# Patient Record
Sex: Male | Born: 1935 | Hispanic: No | Marital: Single | Smoking: Never smoker
Health system: Southern US, Community
[De-identification: ages and names within clinical notes are randomized; demographics above are authoritative.]

---

## 2005-12-19 ENCOUNTER — Encounter: Admission: RE | Admit: 2005-12-19 | Discharge: 2005-12-19 | Payer: Self-pay | Admitting: Sports Medicine

## 2010-06-25 DIAGNOSIS — M542 Cervicalgia: Secondary | ICD-10-CM | POA: Insufficient documentation

## 2010-06-25 DIAGNOSIS — M549 Dorsalgia, unspecified: Secondary | ICD-10-CM | POA: Insufficient documentation

## 2010-06-25 DIAGNOSIS — G319 Degenerative disease of nervous system, unspecified: Secondary | ICD-10-CM | POA: Insufficient documentation

## 2012-08-25 ENCOUNTER — Other Ambulatory Visit: Payer: Self-pay | Admitting: Internal Medicine

## 2012-08-25 DIAGNOSIS — R269 Unspecified abnormalities of gait and mobility: Secondary | ICD-10-CM

## 2012-08-25 DIAGNOSIS — W19XXXD Unspecified fall, subsequent encounter: Secondary | ICD-10-CM

## 2012-08-28 ENCOUNTER — Ambulatory Visit
Admission: RE | Admit: 2012-08-28 | Discharge: 2012-08-28 | Disposition: A | Discharge status: Home / Self Care | Payer: Medicare Other | Source: Ambulatory Visit | Attending: Internal Medicine | Admitting: Internal Medicine

## 2012-08-28 DIAGNOSIS — R269 Unspecified abnormalities of gait and mobility: Secondary | ICD-10-CM

## 2012-08-28 DIAGNOSIS — W19XXXD Unspecified fall, subsequent encounter: Secondary | ICD-10-CM

## 2012-12-24 ENCOUNTER — Encounter: Payer: Self-pay | Admitting: Gastroenterology

## 2013-01-21 ENCOUNTER — Ambulatory Visit: Payer: Medicare Other | Admitting: Gastroenterology

## 2013-02-17 ENCOUNTER — Telehealth: Payer: Self-pay | Admitting: Medical Oncology

## 2013-02-17 NOTE — Telephone Encounter (Signed)
Opened by mistake.

## 2013-03-15 ENCOUNTER — Other Ambulatory Visit: Payer: Self-pay | Admitting: Medical Oncology

## 2013-03-15 NOTE — Telephone Encounter (Signed)
Opened by mistake.

## 2018-09-05 ENCOUNTER — Inpatient Hospital Stay (HOSPITAL_COMMUNITY)
Admission: EM | Admit: 2018-09-05 | Discharge: 2018-09-10 | DRG: 871 | Disposition: A | Discharge status: Skilled Nursing Facility | Payer: Medicare Other | Attending: Internal Medicine | Admitting: Internal Medicine

## 2018-09-05 ENCOUNTER — Encounter (HOSPITAL_COMMUNITY): Payer: Self-pay

## 2018-09-05 ENCOUNTER — Emergency Department (HOSPITAL_COMMUNITY): Payer: Medicare Other

## 2018-09-05 DIAGNOSIS — Z20828 Contact with and (suspected) exposure to other viral communicable diseases: Secondary | ICD-10-CM | POA: Diagnosis present

## 2018-09-05 DIAGNOSIS — F0390 Unspecified dementia without behavioral disturbance: Secondary | ICD-10-CM | POA: Diagnosis present

## 2018-09-05 DIAGNOSIS — N179 Acute kidney failure, unspecified: Secondary | ICD-10-CM | POA: Diagnosis present

## 2018-09-05 DIAGNOSIS — G9340 Encephalopathy, unspecified: Secondary | ICD-10-CM

## 2018-09-05 DIAGNOSIS — G9341 Metabolic encephalopathy: Secondary | ICD-10-CM | POA: Diagnosis present

## 2018-09-05 DIAGNOSIS — D649 Anemia, unspecified: Secondary | ICD-10-CM | POA: Diagnosis present

## 2018-09-05 DIAGNOSIS — N3001 Acute cystitis with hematuria: Secondary | ICD-10-CM

## 2018-09-05 DIAGNOSIS — R296 Repeated falls: Secondary | ICD-10-CM | POA: Diagnosis present

## 2018-09-05 DIAGNOSIS — N390 Urinary tract infection, site not specified: Secondary | ICD-10-CM

## 2018-09-05 DIAGNOSIS — R410 Disorientation, unspecified: Secondary | ICD-10-CM | POA: Diagnosis not present

## 2018-09-05 DIAGNOSIS — Z66 Do not resuscitate: Secondary | ICD-10-CM | POA: Diagnosis present

## 2018-09-05 DIAGNOSIS — A419 Sepsis, unspecified organism: Secondary | ICD-10-CM | POA: Diagnosis present

## 2018-09-05 LAB — CBC WITH DIFFERENTIAL/PLATELET
Abs Immature Granulocytes: 0.14 10*3/uL — ABNORMAL HIGH (ref 0.00–0.07)
Basophils Absolute: 0 10*3/uL (ref 0.0–0.1)
Basophils Relative: 0 %
Eosinophils Absolute: 0 10*3/uL (ref 0.0–0.5)
Eosinophils Relative: 0 %
HCT: 33.3 % — ABNORMAL LOW (ref 39.0–52.0)
Hemoglobin: 9.3 g/dL — ABNORMAL LOW (ref 13.0–17.0)
Immature Granulocytes: 1 %
Lymphocytes Relative: 7 %
Lymphs Abs: 0.9 10*3/uL (ref 0.7–4.0)
MCH: 23.7 pg — ABNORMAL LOW (ref 26.0–34.0)
MCHC: 27.9 g/dL — ABNORMAL LOW (ref 30.0–36.0)
MCV: 84.9 fL (ref 80.0–100.0)
Monocytes Absolute: 2.4 10*3/uL — ABNORMAL HIGH (ref 0.1–1.0)
Monocytes Relative: 18 %
Neutro Abs: 9.4 10*3/uL — ABNORMAL HIGH (ref 1.7–7.7)
Neutrophils Relative %: 74 %
Platelets: 251 10*3/uL (ref 150–400)
RBC: 3.92 MIL/uL — ABNORMAL LOW (ref 4.22–5.81)
RDW: 19.3 % — ABNORMAL HIGH (ref 11.5–15.5)
WBC: 12.9 10*3/uL — ABNORMAL HIGH (ref 4.0–10.5)
nRBC: 0 % (ref 0.0–0.2)

## 2018-09-05 LAB — COMPREHENSIVE METABOLIC PANEL
ALT: 13 U/L (ref 0–44)
AST: 19 U/L (ref 15–41)
Albumin: 2.5 g/dL — ABNORMAL LOW (ref 3.5–5.0)
Alkaline Phosphatase: 102 U/L (ref 38–126)
Anion gap: 8 (ref 5–15)
BUN: 29 mg/dL — ABNORMAL HIGH (ref 8–23)
CO2: 23 mmol/L (ref 22–32)
Calcium: 8.9 mg/dL (ref 8.9–10.3)
Chloride: 105 mmol/L (ref 98–111)
Creatinine, Ser: 1.33 mg/dL — ABNORMAL HIGH (ref 0.61–1.24)
GFR calc Af Amer: 53 mL/min — ABNORMAL LOW (ref >60)
GFR calc non Af Amer: 46 mL/min — ABNORMAL LOW (ref >60)
Glucose, Bld: 117 mg/dL — ABNORMAL HIGH (ref 70–99)
Potassium: 4.1 mmol/L (ref 3.5–5.1)
Sodium: 136 mmol/L (ref 135–145)
Total Bilirubin: 1.9 mg/dL — ABNORMAL HIGH (ref 0.3–1.2)
Total Protein: 6.8 g/dL (ref 6.5–8.1)

## 2018-09-05 LAB — URINALYSIS, ROUTINE W REFLEX MICROSCOPIC
Bilirubin Urine: NEGATIVE
Glucose, UA: NEGATIVE mg/dL
Hgb urine dipstick: NEGATIVE
Ketones, ur: 5 mg/dL — AB
Nitrite: POSITIVE — AB
Protein, ur: NEGATIVE mg/dL
Specific Gravity, Urine: 1.023 (ref 1.005–1.030)
pH: 5 (ref 5.0–8.0)

## 2018-09-05 LAB — VITAMIN B12: Vitamin B-12: 488 pg/mL (ref 180–914)

## 2018-09-05 LAB — AMMONIA: Ammonia: 11 umol/L (ref 9–35)

## 2018-09-05 LAB — TSH: TSH: 3.41 u[IU]/mL (ref 0.350–4.500)

## 2018-09-05 LAB — CBG MONITORING, ED: Glucose-Capillary: 106 mg/dL — ABNORMAL HIGH (ref 70–99)

## 2018-09-05 LAB — IRON AND TIBC
Iron: 16 ug/dL — ABNORMAL LOW (ref 45–182)
Saturation Ratios: 6 % — ABNORMAL LOW (ref 17.9–39.5)
TIBC: 266 ug/dL (ref 250–450)
UIBC: 250 ug/dL

## 2018-09-05 LAB — RETICULOCYTES
Immature Retic Fract: 30.2 % — ABNORMAL HIGH (ref 2.3–15.9)
RBC.: 3.83 MIL/uL — ABNORMAL LOW (ref 4.22–5.81)
Retic Count, Absolute: 64.7 K/uL (ref 19.0–186.0)
Retic Ct Pct: 1.7 % (ref 0.4–3.1)

## 2018-09-05 LAB — FERRITIN: Ferritin: 21 ng/mL — ABNORMAL LOW (ref 24–336)

## 2018-09-05 LAB — PROTIME-INR
INR: 1.1 (ref 0.8–1.2)
Prothrombin Time: 14.3 seconds (ref 11.4–15.2)

## 2018-09-05 LAB — CK: Total CK: 116 U/L (ref 49–397)

## 2018-09-05 LAB — LIPASE, BLOOD: Lipase: 21 U/L (ref 11–51)

## 2018-09-05 LAB — SARS CORONAVIRUS 2 BY RT PCR (HOSPITAL ORDER, PERFORMED IN ~~LOC~~ HOSPITAL LAB): SARS Coronavirus 2: NEGATIVE

## 2018-09-05 LAB — FOLATE: Folate: 12.1 ng/mL

## 2018-09-05 LAB — TROPONIN I: Troponin I: <0.03 ng/mL

## 2018-09-05 LAB — MAGNESIUM: Magnesium: 2.1 mg/dL (ref 1.7–2.4)

## 2018-09-05 MED ORDER — ENOXAPARIN SODIUM 40 MG/0.4ML ~~LOC~~ SOLN
40.0000 mg | SUBCUTANEOUS | Status: DC
  Administered 2018-09-05 – 2018-09-09 (×5): 40 mg via SUBCUTANEOUS
  Filled 2018-09-05 (×5): qty 0.4

## 2018-09-05 MED ORDER — SODIUM CHLORIDE 0.9 % IV SOLN
1.0000 g | INTRAVENOUS | Status: DC
  Administered 2018-09-06: 1 g via INTRAVENOUS
  Filled 2018-09-05 (×2): qty 10

## 2018-09-05 MED ORDER — LORAZEPAM 2 MG/ML IJ SOLN
1.0000 mg | Freq: Once | INTRAMUSCULAR | Status: AC
  Administered 2018-09-05: 1 mg via INTRAVENOUS
  Filled 2018-09-05: qty 1

## 2018-09-05 MED ORDER — HALOPERIDOL LACTATE 5 MG/ML IJ SOLN
2.0000 mg | Freq: Four times a day (QID) | INTRAMUSCULAR | Status: DC | PRN
  Administered 2018-09-07 – 2018-09-09 (×2): 2 mg via INTRAVENOUS
  Filled 2018-09-05 (×3): qty 1

## 2018-09-05 MED ORDER — ACETAMINOPHEN 325 MG PO TABS
650.0000 mg | ORAL_TABLET | Freq: Four times a day (QID) | ORAL | Status: DC | PRN
  Administered 2018-09-07: 650 mg via ORAL
  Filled 2018-09-05 (×2): qty 2

## 2018-09-05 MED ORDER — SODIUM CHLORIDE 0.9 % IV BOLUS
500.0000 mL | Freq: Once | INTRAVENOUS | Status: AC
  Administered 2018-09-05: 500 mL via INTRAVENOUS

## 2018-09-05 MED ORDER — ONDANSETRON HCL 4 MG PO TABS: 4.0000 mg | ORAL_TABLET | Freq: Four times a day (QID) | ORAL | Status: DC | PRN

## 2018-09-05 MED ORDER — SODIUM CHLORIDE 0.9 % IV SOLN
1.0000 g | Freq: Once | INTRAVENOUS | Status: AC
  Administered 2018-09-05: 1 g via INTRAVENOUS
  Filled 2018-09-05: qty 10

## 2018-09-05 MED ORDER — SODIUM CHLORIDE 0.9 % IV SOLN
INTRAVENOUS | Status: DC
  Administered 2018-09-05 – 2018-09-06 (×2): via INTRAVENOUS

## 2018-09-05 MED ORDER — ONDANSETRON HCL 4 MG/2ML IJ SOLN
4.0000 mg | Freq: Four times a day (QID) | INTRAMUSCULAR | Status: DC | PRN
  Administered 2018-09-08: 4 mg via INTRAVENOUS
  Filled 2018-09-05: qty 2

## 2018-09-05 MED ORDER — ACETAMINOPHEN 650 MG RE SUPP: 650.0000 mg | Freq: Four times a day (QID) | RECTAL | Status: DC | PRN

## 2018-09-05 MED ORDER — LORAZEPAM 2 MG/ML IJ SOLN
0.5000 mg | Freq: Once | INTRAMUSCULAR | Status: AC
  Administered 2018-09-05: 0.5 mg via INTRAVENOUS
  Filled 2018-09-05: qty 1

## 2018-09-05 NOTE — H&P (Signed)
History and Physical    Arthur Sims AOZ:[id number] DOB: 22-May-1924 DOA: 09/05/2018  Referring MD/NP/PA: EDP PCP:  Patient coming from: Home  Chief Complaint: Confusion, falls  HPI: Arthur Sims is a 85 y.o. male with no significant past medical history, patient has not been to a doctor in over 10 years, does not take any medications on a regular basis.  He lives by himself but has a daughter that checks on him frequently she reports mild memory and cognitive decline for months to a year. -Over the last 3 to 4 days patient has been more confused than usual, daughter reports multiple falls as well over the last 3 days, on Thursday EMS was called following a fall but patient refused to come to the ED for further evaluation subsequently he fell couple more times he denies any pain anywhere, has been more agitated and confused over the last couple of days which is why his daughter had him brought to the emergency room today ED Course: Vital signs stable, labs notable for creatinine of 1.3, white count of 12.9, hemoglobin of 9.3, abnormal UA with positive nitrite, leuk esterase, many bacteria and 6-10 WBCs, CT head noted diffuse atrophy, no acute abnormalities, SARS COVID-19 PCR was negative. -Patient was agitated in the emergency room was given Ativan IV x1 however continues to be angry agitated and confused  Review of Systems: Unable to obtain due to confusion  History reviewed. No pertinent past medical history.  History reviewed. No pertinent surgical history.   has no history on file for tobacco, alcohol, and drug.  No Known Allergies  No family history on file.   Prior to Admission medications   Not on File    Physical Exam: Vitals:   09/05/18 1415 09/05/18 1500 09/05/18 1530 09/05/18 1600  BP: 114/72 117/69 (!) 111/97 125/71  Pulse: 76 87    Resp: Temp:      TempSrc:      SpO2: 99% 99%        Constitutional: Awake alert disoriented confused and  agitated Vitals:   09/05/18 1415 09/05/18 1500 09/05/18 1530 09/05/18 1600  BP: 114/72 117/69 (!) 111/97 125/71  Pulse: 76 87    Resp: Temp:      TempSrc:      SpO2: 99% 99%     Eyes: PERRL, lids and conjunctivae normal ENMT: Mucous membranes are moist.  Neck: normal, supple Respiratory: Good air movement bilaterally, normal respiratory effort  Cardiovascular: Regular rate and rhythm, no murmurs / rubs / gallops Abdomen: Soft, nontender, nondistended, bowel sounds present Musculoskeletal: No joint deformity upper and lower extremities. Ext: No edema Skin: no rashes, lesions, ulcers.  Neurologic: Awake alert, disoriented, confused, does not follow commands, moves all extremities involuntarily Psychiatric: Confused, unable to assess  Labs on Admission: I have personally reviewed following labs and imaging studies  CBC: Recent Labs  Lab 09/05/18 1204  WBC 12.9*  NEUTROABS 9.4*  HGB 9.3*  HCT 33.3*  MCV 84.9  PLT 251   Basic Metabolic Panel: Recent Labs  Lab 09/05/18 1204  NA 136  K 4.1  CL 105  CO2 23  GLUCOSE 117*  BUN 29*  CREATININE 1.33*  CALCIUM 8.9  MG 2.1   GFR: CrCl cannot be calculated (Unknown ideal weight.). Liver Function Tests: Recent Labs  Lab 09/05/18 1204  AST 19  ALT 13  ALKPHOS 102  BILITOT 1.9*  PROT 6.8  ALBUMIN 2.5*  Recent Labs  Lab 09/05/18 1204  LIPASE 21   Recent Labs  Lab 09/05/18 1204  AMMONIA 11   Coagulation Profile: Recent Labs  Lab 09/05/18 1204  INR 1.1   Cardiac Enzymes: Recent Labs  Lab 09/05/18 1204  CKTOTAL 116  TROPONINI <0.03   BNP (last 3 results) No results for input(s): PROBNP in the last 8760 hours. HbA1C: No results for input(s): HGBA1C in the last 72 hours. CBG: Recent Labs  Lab 09/05/18 1153  GLUCAP 106*   Lipid Profile: No results for input(s): CHOL, HDL, LDLCALC, TRIG, CHOLHDL, LDLDIRECT in the last 72 hours. Thyroid Function Tests: Recent Labs    09/05/18 1204   TSH 3.410   Anemia Panel: No results for input(s): VITAMINB12, FOLATE, FERRITIN, TIBC, IRON, RETICCTPCT in the last 72 hours. Urine analysis:    Component Value Date/Time   COLORURINE AMBER (A) 09/05/2018 1440   APPEARANCEUR HAZY (A) 09/05/2018 1440   LABSPEC 1.023 09/05/2018 1440   PHURINE 5.0 09/05/2018 1440   GLUCOSEU NEGATIVE 09/05/2018 1440   HGBUR NEGATIVE 09/05/2018 1440   BILIRUBINUR NEGATIVE 09/05/2018 1440   KETONESUR 5 (A) 09/05/2018 1440   PROTEINUR NEGATIVE 09/05/2018 1440   NITRITE POSITIVE (A) 09/05/2018 1440   LEUKOCYTESUR SMALL (A) 09/05/2018 1440   Sepsis Labs: (procalcitonin:4,lacticidven:4) ) Recent Results (from the past 240 hour(s))  SARS Coronavirus 2 (CEPHEID - Performed in [facility] hospital lab), Hosp Order     Status: None   Collection Time: 09/05/18  1:10 PM  Result Value Ref Range Status   SARS Coronavirus 2 NEGATIVE NEGATIVE Final    Comment: (NOTE) If result is NEGATIVE SARS-CoV-2 target nucleic acids are NOT DETECTED. The SARS-CoV-2 RNA is generally detectable in upper and lower  respiratory specimens during the acute phase of infection. The lowest  concentration of SARS-CoV-2 viral copies this assay can detect is 250  copies / mL. A negative result does not preclude SARS-CoV-2 infection  and should not be used as the sole basis for treatment or other  patient management decisions.  A negative result may occur with  improper specimen collection / handling, submission of specimen other  than nasopharyngeal swab, presence of viral mutation(s) within the  areas targeted by this assay, and inadequate number of viral copies  (<250 copies / mL). A negative result must be combined with clinical  observations, patient history, and epidemiological information. If result is POSITIVE SARS-CoV-2 target nucleic acids are DETECTED. The SARS-CoV-2 RNA is generally detectable in upper and lower  respiratory specimens dur ing the acute phase  of infection.  Positive  results are indicative of active infection with SARS-CoV-2.  Clinical  correlation with patient history and other diagnostic information is  necessary to determine patient infection status.  Positive results do  not rule out bacterial infection or co-infection with other viruses. If result is PRESUMPTIVE POSTIVE SARS-CoV-2 nucleic acids MAY BE PRESENT.   A presumptive positive result was obtained on the submitted specimen  and confirmed on repeat testing.  While 2019 novel coronavirus  (SARS-CoV-2) nucleic acids may be present in the submitted sample  additional confirmatory testing may be necessary for epidemiological  and / or clinical management purposes  to differentiate between  SARS-CoV-2 and other Sarbecovirus currently known to infect humans.  If clinically indicated additional testing with an alternate test  methodology [phone]) is advised. The SARS-CoV-2 RNA is generally  detectable in upper and lower respiratory sp ecimens during the acute  phase of infection. The expected result  is Negative. Fact Sheet for Patients:  BoilerBrush.com.cyhttps://www.fda.gov/media/136312/download Fact Sheet for Healthcare Providers: https://pope.com/https://www.fda.gov/media/136313/download This test is not yet approved or cleared by the [place] FDA and has been authorized for detection and/or diagnosis of SARS-CoV-2 by FDA under an Emergency Use Authorization (EUA).  This EUA will remain in effect (meaning this test can be used) for the duration of the COVID-19 declaration under Section 564(b)(1) of the Act, 21 U.S.C. section 360bbb-3(b)(1), unless the authorization is terminated or revoked sooner. Performed at [facility], [address] [place], [place] [address]      Radiological Exams on Admission: Dg Chest 2 View  Result Date: 09/05/2018 CLINICAL DATA:  Fall and altered mental status. EXAM: CHEST - 2 VIEW COMPARISON:  None FINDINGS: Two views of the chest demonstrate low lung  volumes. Coarse lung markings may be chronic. Heart size is upper limits of normal and likely accentuated by low inspiratory effort. However, there may be an enlarged ascending thoracic aorta. Trachea is midline. Negative for a pneumothorax. Bridging osteophytes in the mid and lower thoracic spine region. No large pleural effusions. IMPRESSION: No active cardiopulmonary disease. Concern for enlarged ascending thoracic aorta. Electronically Signed   By: Richarda OverlieAdam  Henn M.D.   On: 09/05/2018 13:37   Ct Head Wo Contrast  Result Date: 09/05/2018 CLINICAL DATA:  85 year old with multiple falls. EXAM: CT HEAD WITHOUT CONTRAST CT CERVICAL SPINE WITHOUT CONTRAST TECHNIQUE: Multidetector CT imaging of the head and cervical spine was performed following the standard protocol without intravenous contrast. Multiplanar CT image reconstructions of the cervical spine were also generated. COMPARISON:  08/28/2012 FINDINGS: CT HEAD FINDINGS Brain: Diffuse cerebral atrophy. Extensive low density throughout the white matter and similar to the prior examination. No evidence for acute hemorrhage, mass lesion, midline shift, hydrocephalus or large infarct. Vascular: No hyperdense vessel or unexpected calcification. Skull: Normal. Negative for fracture or focal lesion. Sinuses/Orbits: Small amount of mucosal disease in the sphenoid sinuses. Other: None. CT CERVICAL SPINE FINDINGS Alignment: Normal. Skull base and vertebrae: No acute fracture. No primary bone lesion or focal pathologic process. Soft tissues and spinal canal: No prevertebral fluid or swelling. No visible canal hematoma. Disc levels: Mild disc space narrowing throughout the cervical spine, most prominent at C3-C4. Facet arthropathy along the right side C2-C3. Upper chest: Negative. Other: None IMPRESSION: 1. No acute intracranial abnormality. 2. No acute bone abnormality in cervical spine. 3. Diffuse cerebral atrophy. Chronic white matter disease likely represents chronic  small vessel ischemic changes. Electronically Signed   By: Richarda OverlieAdam  Henn M.D.   On: 09/05/2018 14:22   Ct Cervical Spine Wo Contrast  Result Date: 09/05/2018 CLINICAL DATA:  85 year old with multiple falls. EXAM: CT HEAD WITHOUT CONTRAST CT CERVICAL SPINE WITHOUT CONTRAST TECHNIQUE: Multidetector CT imaging of the head and cervical spine was performed following the standard protocol without intravenous contrast. Multiplanar CT image reconstructions of the cervical spine were also generated. COMPARISON:  08/28/2012 FINDINGS: CT HEAD FINDINGS Brain: Diffuse cerebral atrophy. Extensive low density throughout the white matter and similar to the prior examination. No evidence for acute hemorrhage, mass lesion, midline shift, hydrocephalus or large infarct. Vascular: No hyperdense vessel or unexpected calcification. Skull: Normal. Negative for fracture or focal lesion. Sinuses/Orbits: Small amount of mucosal disease in the sphenoid sinuses. Other: None. CT CERVICAL SPINE FINDINGS Alignment: Normal. Skull base and vertebrae: No acute fracture. No primary bone lesion or focal pathologic process. Soft tissues and spinal canal: No prevertebral fluid or swelling. No visible canal hematoma. Disc levels:  Mild disc space narrowing throughout the cervical spine, most prominent at C3-C4. Facet arthropathy along the right side C2-C3. Upper chest: Negative. Other: None IMPRESSION: 1. No acute intracranial abnormality. 2. No acute bone abnormality in cervical spine. 3. Diffuse cerebral atrophy. Chronic white matter disease likely represents chronic small vessel ischemic changes. Electronically Signed   By: Richarda Overlie M.D.   On: 09/05/2018 14:22    EKG: Independently reviewed.  Normal sinus rhythm, no acute ST-T wave changes  Assessment/Plan Active Problems:   Acute metabolic encephalopathy -Daughter reports acute decompensation in cognition over the last 3 to 4 days in the background of mild cognitive decline and memory  loss -CT head unremarkable, except for chronic atrophy -UA is abnormal, suspicious for UTI, unable to obtain history regarding this, no evidence of urinary retention when INO cath completed per RN -IV ceftriaxone every 24 hours, follow-up urine cultures -Hydrate with IV fluids -Chest x-ray was unremarkable -Check TSH and B12 levels as well -Use Haldol as needed for severe agitation, QTC is 457    Sepsis secondary to UTI (HCC) -As above, monitor for retention  Renal insufficiency -Baseline unknown,  -hydrate saline at 75 cc an hour and monitor  Anemia -Baseline unknown, check anemia panel  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

## 2018-09-05 NOTE — Progress Notes (Signed)
NEW ADMISSION NOTE New Admission Note:   Arrival Method:  From ED via stretcher  Mental Orientation: alert to self  Telemetry: box 6 Assessment: Completed Skin: see assessment  IV:LAC  Pain: denies  Tubes: none  Safety Measures: Safety Fall Prevention Plan has been discussed. LOW Bed in place, floor mats also in place and bed alarm on.  Admission: Pending due to altered mental status  [facility] [place] Orientation: Patient has been orientated to the room, unit and staff.  Family:none   Orders have been reviewed and implemented. Will continue to monitor the patient. Call light has been placed within reach and bed alarm has been activated.   Leonia Reeves, RN

## 2018-09-05 NOTE — ED Notes (Signed)
ED TO INPATIENT HANDOFF REPORT  ED Nurse Name and Phone #: [phone] Hannie  S Name/Age/Gender Arthur Sims 85 y.o. male Room/Bed: 024C/024C  Code Status   Code Status: Not on file  Home/SNF/Other Home Patient oriented to: self Is this baseline? No   Triage Complete: Triage complete  Chief Complaint Altered  Triage Note Pt brought in by [facility] from home for multiple falls over the past several days and AMS. Pt A+Ox4 at baseline, currently is only oriented to self. Pt denies pain, no obvious injuries. Pt does not see a PCP. Pt daughter Arthur Sims [phone]. Pt skin warm and dry.   Allergies No Known Allergies  Level of Care/Admitting Diagnosis ED Disposition    ED Disposition Condition Comment   Admit  Hospital Area: [facility] [[id number]]  Level of Care: Med-Surg [16]  Covid Evaluation: N/A  Diagnosis: Acute metabolic encephalopathy [[id number]]  Admitting Physician: Zannie Cove [[id number]]  Attending Physician: Zannie Cove [[id number]]  Estimated length of stay: past midnight tomorrow  Certification:: I certify this patient will need inpatient services for at least 2 midnights  PT Class (Do Not Modify): Inpatient [101]  PT Acc Code (Do Not Modify): Private [1]       B Medical/Surgery History History reviewed. No pertinent past medical history. History reviewed. No pertinent surgical history.   A IV Location/Drains/Wounds Patient Lines/Drains/Airways Status   Active Line/Drains/Airways    Name:   Placement date:   Placement time:   Site:   Days:   Peripheral IV 09/05/18 Left Antecubital   09/05/18    1201    Antecubital   less than 1          Intake/Output Last 24 hours No intake or output data in the 24 hours ending 09/05/18 1658  Labs/Imaging Results for orders placed or performed during the hospital encounter of 09/05/18 (from the past 48 hour(s))  CBG monitoring, ED     Status: Abnormal   Collection Time: 09/05/18 11:53 AM  Result Value Ref  Range   Glucose-Capillary 106 (H) 70 - 99 mg/dL   Comment 1 Notify RN    Comment 2 Document in Chart   Comprehensive metabolic panel     Status: Abnormal   Collection Time: 09/05/18 12:04 PM  Result Value Ref Range   Sodium 136 135 - 145 mmol/L   Potassium 4.1 3.5 - 5.1 mmol/L   Chloride 105 98 - 111 mmol/L   CO2 23 22 - 32 mmol/L   Glucose, Bld 117 (H) 70 - 99 mg/dL   BUN 29 (H) 8 - 23 mg/dL   Creatinine, Ser 0.76 (H) 0.61 - 1.24 mg/dL   Calcium 8.9 8.9 - 22.6 mg/dL   Total Protein 6.8 6.5 - 8.1 g/dL   Albumin 2.5 (L) 3.5 - 5.0 g/dL   AST 19 15 - 41 U/L   ALT 13 0 - 44 U/L   Alkaline Phosphatase 102 38 - 126 U/L   Total Bilirubin 1.9 (H) 0.3 - 1.2 mg/dL   GFR calc non Af Amer 46 (L) >60 mL/min   GFR calc Af Amer 53 (L) >60 mL/min   Anion gap 8 5 - 15    Comment: Performed at [facility], [address], [place], [place] [address]  Lipase, blood     Status: None   Collection Time: 09/05/18 12:04 PM  Result Value Ref Range   Lipase 21 11 - 51 U/L    Comment: Performed at [facility]  Lab, [address], [place], [place] [address]  Ammonia     Status: None   Collection Time: 09/05/18 12:04 PM  Result Value Ref Range   Ammonia 11 9 - 35 umol/L    Comment: Performed at [facility], [address], [place], [place] [address]  Protime-INR     Status: None   Collection Time: 09/05/18 12:04 PM  Result Value Ref Range   Prothrombin Time 14.3 11.4 - 15.2 seconds   INR 1.1 0.8 - 1.2    Comment: (NOTE) INR goal varies based on device and disease states. Performed at [facility], [address] [place], [place] [address]   CBC with Differential     Status: Abnormal   Collection Time: 09/05/18 12:04 PM  Result Value Ref Range   WBC 12.9 (H) 4.0 - 10.5 K/uL   RBC 3.92 (L) 4.22 - 5.81 MIL/uL   Hemoglobin 9.3 (L) 13.0 - 17.0 g/dL   HCT 44.0 (L) 10.2 - 72.5 %   MCV 84.9 80.0 - 100.0 fL   MCH 23.7 (L) 26.0 - 34.0 pg   MCHC 27.9 (L) 30.0 - 36.0 g/dL   RDW  36.6 (H) 44.0 - 15.5 %   Platelets 251 150 - 400 K/uL   nRBC 0.0 0.0 - 0.2 %   Neutrophils Relative % 74 %   Neutro Abs 9.4 (H) 1.7 - 7.7 K/uL   Lymphocytes Relative 7 %   Lymphs Abs 0.9 0.7 - 4.0 K/uL   Monocytes Relative 18 %   Monocytes Absolute 2.4 (H) 0.1 - 1.0 K/uL   Eosinophils Relative 0 %   Eosinophils Absolute 0.0 0.0 - 0.5 K/uL   Basophils Relative 0 %   Basophils Absolute 0.0 0.0 - 0.1 K/uL   Immature Granulocytes 1 %   Abs Immature Granulocytes 0.14 (H) 0.00 - 0.07 K/uL    Comment: Performed at [facility], [address] [place], [place] [address]  Magnesium     Status: None   Collection Time: 09/05/18 12:04 PM  Result Value Ref Range   Magnesium 2.1 1.7 - 2.4 mg/dL    Comment: Performed at [facility], [address] [place], [place] [address]  TSH     Status: None   Collection Time: 09/05/18 12:04 PM  Result Value Ref Range   TSH 3.410 0.350 - 4.500 uIU/mL    Comment: Performed by a 3rd Generation assay with a functional sensitivity of <=0.01 uIU/mL. Performed at [facility], [address], [place], [place] [address]   CK     Status: None   Collection Time: 09/05/18 12:04 PM  Result Value Ref Range   Total CK 116 49 - 397 U/L    Comment: Performed at [facility], [address] [place], [place] [address]  Troponin I - ONCE - STAT     Status: None   Collection Time: 09/05/18 12:04 PM  Result Value Ref Range   Troponin I <0.03 <0.03 ng/mL    Comment: Performed at [facility], [address], [place], [place] [address]  SARS Coronavirus 2 (CEPHEID - Performed in [facility] hospital lab), Hosp Order     Status: None   Collection Time: 09/05/18  1:10 PM  Result Value Ref Range   SARS Coronavirus 2 NEGATIVE NEGATIVE    Comment: (NOTE) If result is NEGATIVE SARS-CoV-2 target nucleic acids are NOT DETECTED. The SARS-CoV-2 RNA is generally detectable  in upper and lower  respiratory specimens during the acute phase of  infection. The lowest  concentration of SARS-CoV-2 viral copies this assay can detect is 250  copies / mL. A negative result does not preclude SARS-CoV-2 infection  and should not be used as the sole basis for treatment or other  patient management decisions.  A negative result may occur with  improper specimen collection / handling, submission of specimen other  than nasopharyngeal swab, presence of viral mutation(s) within the  areas targeted by this assay, and inadequate number of viral copies  (<250 copies / mL). A negative result must be combined with clinical  observations, patient history, and epidemiological information. If result is POSITIVE SARS-CoV-2 target nucleic acids are DETECTED. The SARS-CoV-2 RNA is generally detectable in upper and lower  respiratory specimens dur ing the acute phase of infection.  Positive  results are indicative of active infection with SARS-CoV-2.  Clinical  correlation with patient history and other diagnostic information is  necessary to determine patient infection status.  Positive results do  not rule out bacterial infection or co-infection with other viruses. If result is PRESUMPTIVE POSTIVE SARS-CoV-2 nucleic acids MAY BE PRESENT.   A presumptive positive result was obtained on the submitted specimen  and confirmed on repeat testing.  While 2019 novel coronavirus  (SARS-CoV-2) nucleic acids may be present in the submitted sample  additional confirmatory testing may be necessary for epidemiological  and / or clinical management purposes  to differentiate between  SARS-CoV-2 and other Sarbecovirus currently known to infect humans.  If clinically indicated additional testing with an alternate test  methodology [phone]) is advised. The SARS-CoV-2 RNA is generally  detectable in upper and lower respiratory sp ecimens during the acute  phase of infection. The expected result is Negative. Fact Sheet for Patients:   BoilerBrush.com.cy Fact Sheet for Healthcare Providers: https://pope.com/ This test is not yet approved or cleared by the [place] FDA and has been authorized for detection and/or diagnosis of SARS-CoV-2 by FDA under an Emergency Use Authorization (EUA).  This EUA will remain in effect (meaning this test can be used) for the duration of the COVID-19 declaration under Section 564(b)(1) of the Act, 21 U.S.C. section 360bbb-3(b)(1), unless the authorization is terminated or revoked sooner. Performed at [facility], [address] [place], [place] [address]   Urinalysis, Routine w reflex microscopic     Status: Abnormal   Collection Time: 09/05/18  2:40 PM  Result Value Ref Range   Color, Urine AMBER (A) YELLOW    Comment: BIOCHEMICALS MAY BE AFFECTED BY COLOR   APPearance HAZY (A) CLEAR   Specific Gravity, Urine 1.023 1.005 - 1.030   pH 5.0 5.0 - 8.0   Glucose, UA NEGATIVE NEGATIVE mg/dL   Hgb urine dipstick NEGATIVE NEGATIVE   Bilirubin Urine NEGATIVE NEGATIVE   Ketones, ur 5 (A) NEGATIVE mg/dL   Protein, ur NEGATIVE NEGATIVE mg/dL   Nitrite POSITIVE (A) NEGATIVE   Leukocytes,Ua SMALL (A) NEGATIVE   RBC / HPF 0-5 0 - 5 RBC/hpf   WBC, UA 6-10 0 - 5 WBC/hpf   Bacteria, UA MANY (A) NONE SEEN   Squamous Epithelial / LPF 0-5 0 - 5   Mucus PRESENT    Hyaline Casts, UA PRESENT    Ca Oxalate Crys, UA PRESENT     Comment: Performed at [facility], [address] [place], [place] [address]   Dg Chest 2 View  Result Date: 09/05/2018 CLINICAL  DATA:  Fall and altered mental status. EXAM: CHEST - 2 VIEW COMPARISON:  None FINDINGS: Two views of the chest demonstrate low lung volumes. Coarse lung markings may be chronic. Heart size is upper limits of normal and likely accentuated by low inspiratory effort. However, there may be an enlarged ascending thoracic aorta. Trachea is midline. Negative for a pneumothorax. Bridging  osteophytes in the mid and lower thoracic spine region. No large pleural effusions. IMPRESSION: No active cardiopulmonary disease. Concern for enlarged ascending thoracic aorta. Electronically Signed   By: Richarda OverlieAdam  Henn M.D.   On: 09/05/2018 13:37   Ct Head Wo Contrast  Result Date: 09/05/2018 CLINICAL DATA:  85 year old with multiple falls. EXAM: CT HEAD WITHOUT CONTRAST CT CERVICAL SPINE WITHOUT CONTRAST TECHNIQUE: Multidetector CT imaging of the head and cervical spine was performed following the standard protocol without intravenous contrast. Multiplanar CT image reconstructions of the cervical spine were also generated. COMPARISON:  08/28/2012 FINDINGS: CT HEAD FINDINGS Brain: Diffuse cerebral atrophy. Extensive low density throughout the white matter and similar to the prior examination. No evidence for acute hemorrhage, mass lesion, midline shift, hydrocephalus or large infarct. Vascular: No hyperdense vessel or unexpected calcification. Skull: Normal. Negative for fracture or focal lesion. Sinuses/Orbits: Small amount of mucosal disease in the sphenoid sinuses. Other: None. CT CERVICAL SPINE FINDINGS Alignment: Normal. Skull base and vertebrae: No acute fracture. No primary bone lesion or focal pathologic process. Soft tissues and spinal canal: No prevertebral fluid or swelling. No visible canal hematoma. Disc levels: Mild disc space narrowing throughout the cervical spine, most prominent at C3-C4. Facet arthropathy along the right side C2-C3. Upper chest: Negative. Other: None IMPRESSION: 1. No acute intracranial abnormality. 2. No acute bone abnormality in cervical spine. 3. Diffuse cerebral atrophy. Chronic white matter disease likely represents chronic small vessel ischemic changes. Electronically Signed   By: Richarda OverlieAdam  Henn M.D.   On: 09/05/2018 14:22   Ct Cervical Spine Wo Contrast  Result Date: 09/05/2018 CLINICAL DATA:  85 year old with multiple falls. EXAM: CT HEAD WITHOUT CONTRAST CT CERVICAL  SPINE WITHOUT CONTRAST TECHNIQUE: Multidetector CT imaging of the head and cervical spine was performed following the standard protocol without intravenous contrast. Multiplanar CT image reconstructions of the cervical spine were also generated. COMPARISON:  08/28/2012 FINDINGS: CT HEAD FINDINGS Brain: Diffuse cerebral atrophy. Extensive low density throughout the white matter and similar to the prior examination. No evidence for acute hemorrhage, mass lesion, midline shift, hydrocephalus or large infarct. Vascular: No hyperdense vessel or unexpected calcification. Skull: Normal. Negative for fracture or focal lesion. Sinuses/Orbits: Small amount of mucosal disease in the sphenoid sinuses. Other: None. CT CERVICAL SPINE FINDINGS Alignment: Normal. Skull base and vertebrae: No acute fracture. No primary bone lesion or focal pathologic process. Soft tissues and spinal canal: No prevertebral fluid or swelling. No visible canal hematoma. Disc levels: Mild disc space narrowing throughout the cervical spine, most prominent at C3-C4. Facet arthropathy along the right side C2-C3. Upper chest: Negative. Other: None IMPRESSION: 1. No acute intracranial abnormality. 2. No acute bone abnormality in cervical spine. 3. Diffuse cerebral atrophy. Chronic white matter disease likely represents chronic small vessel ischemic changes. Electronically Signed   By: Richarda OverlieAdam  Henn M.D.   On: 09/05/2018 14:22    Pending Labs Unresulted Labs (From admission, onward)    Start     Ordered   09/05/18 1644  Vitamin B12  (Anemia Panel (PNL))  Add-on,   R     09/05/18 1644   09/05/18 1644  Folate  (  Anemia Panel (PNL))  Add-on,   R     09/05/18 1644   09/05/18 1644  Iron and TIBC  (Anemia Panel (PNL))  Add-on,   R     09/05/18 1644   09/05/18 1644  Ferritin  (Anemia Panel (PNL))  Add-on,   R     09/05/18 1644   09/05/18 1644  Reticulocytes  (Anemia Panel (PNL))  Add-on,   R     09/05/18 1644   09/05/18 1202  Urine culture  ONCE - STAT,    STAT     09/05/18 1201   Signed and Held  CBC  (enoxaparin (LOVENOX)    CrCl >/= 30 ml/min)  Once,   R    Comments:  Baseline for enoxaparin therapy IF NOT ALREADY DRAWN.  Notify MD if PLT < 100 K.    Signed and Held   Signed and Held  Creatinine, serum  (enoxaparin (LOVENOX)    CrCl >/= 30 ml/min)  Once,   R    Comments:  Baseline for enoxaparin therapy IF NOT ALREADY DRAWN.    Signed and Held   Signed and Held  Creatinine, serum  (enoxaparin (LOVENOX)    CrCl >/= 30 ml/min)  Weekly,   R    Comments:  while on enoxaparin therapy    Signed and Held   Signed and Held  CBC  Tomorrow morning,   R     Signed and Held   Signed and Held  Basic metabolic panel  Tomorrow morning,   R     Signed and Held   Signed and Held  TSH  Add-on,   R     Signed and Held          Vitals/Pain Today's Vitals   09/05/18 1500 09/05/18 1530 09/05/18 1600 09/05/18 1638  BP: 117/69 (!) 111/97 125/71 99/83  Pulse: 87   91  Resp: Temp:      TempSrc:      SpO2: 99%   98%    Isolation Precautions No active isolations  Medications Medications  cefTRIAXone (ROCEPHIN) 1 g in sodium chloride 0.9 % 100 mL IVPB (1 g Intravenous New Bag/Given 09/05/18 1635)  haloperidol lactate (HALDOL) injection 2 mg (has no administration in time range)  sodium chloride 0.9 % bolus 500 mL (500 mLs Intravenous New Bag/Given 09/05/18 1529)  LORazepam (ATIVAN) injection 0.5 mg (0.5 mg Intravenous Given 09/05/18 1547)  LORazepam (ATIVAN) injection 1 mg (1 mg Intravenous Given 09/05/18 1627)    Mobility walks with device High fall risk   Focused Assessments Neuro Assessment Handoff:  Swallow screen pass? .         Neuro Assessment: Exceptions to WDL Neuro Checks:      Last Documented NIHSS Modified Score:   Has TPA been given? No If patient is a Neuro Trauma and patient is going to OR before floor call report to 4N Charge nurse: [phone] or [phone]     R Recommendations: See Admitting  Provider Note  Report given to:   Additional Notes:

## 2018-09-05 NOTE — ED Provider Notes (Signed)
[facility] Provider Note   CSN: [id number] Arrival date & time: 09/05/18  1146    History   Chief Complaint Chief Complaint  Patient presents with   Altered Mental Status   Fall    HPI Carolin GuernseyJames G Stowell is a 85 y.o. male.     The history is provided by the patient and medical records. No language interpreter was used.  Fall  This is a new problem. The current episode started more than 2 days ago (multiple falls over last 3 days). The problem occurs daily. The problem has not changed since onset.Associated symptoms include headaches. Pertinent negatives include no chest pain, no abdominal pain and no shortness of breath. Nothing aggravates the symptoms. Nothing relieves the symptoms. He has tried nothing for the symptoms. The treatment provided no relief.    No past medical history on file.  There are no active problems to display for this patient.   History reviewed. No pertinent surgical history.      Home Medications    Prior to Admission medications   Not on File    Family History No family history on file.  Social History Social History   Tobacco Use   Smoking status: Not on file  Substance Use Topics   Alcohol use: Not on file   Drug use: Not on file     Allergies   Patient has no allergy information on record.   Review of Systems Review of Systems  Constitutional: Positive for fatigue. Negative for chills, diaphoresis and fever.  HENT: Negative for congestion.   Eyes: Negative for visual disturbance.  Respiratory: Negative for cough, chest tightness, shortness of breath and wheezing.   Cardiovascular: Negative for chest pain, palpitations and leg swelling.  Gastrointestinal: Negative for abdominal pain, constipation, diarrhea, nausea and vomiting.  Genitourinary: Positive for decreased urine volume. Negative for flank pain and frequency.  Musculoskeletal: Negative for back pain, neck pain and neck  stiffness.  Skin: Negative for wound.  Neurological: Positive for light-headedness and headaches. Negative for dizziness, speech difficulty and weakness.  Psychiatric/Behavioral: Positive for confusion. Negative for agitation.  All other systems reviewed and are negative.    Physical Exam Updated Vital Signs BP (!) 111/97    Pulse 87    Temp 98.1 F (36.7 C) (Oral)    Resp 18    SpO2 99%   Physical Exam Vitals signs and nursing note reviewed.  Constitutional:      General: He is not in acute distress.    Appearance: He is well-developed. He is not ill-appearing, toxic-appearing or diaphoretic.  HENT:     Head: Normocephalic and atraumatic.     Right Ear: There is no impacted cerumen.     Left Ear: There is no impacted cerumen.     Nose: No congestion or rhinorrhea.     Mouth/Throat:     Mouth: Mucous membranes are moist.     Pharynx: No oropharyngeal exudate or posterior oropharyngeal erythema.  Eyes:     Extraocular Movements: Extraocular movements intact.     Conjunctiva/sclera: Conjunctivae normal.     Pupils: Pupils are equal, round, and reactive to light.  Neck:     Musculoskeletal: Neck supple.  Cardiovascular:     Rate and Rhythm: Normal rate and regular rhythm.     Pulses: Normal pulses.     Heart sounds: No murmur.  Pulmonary:     Effort: Pulmonary effort is normal. No respiratory distress.  Breath sounds: Normal breath sounds. No wheezing, rhonchi or rales.  Chest:     Chest wall: No tenderness.  Abdominal:     Palpations: Abdomen is soft.     Tenderness: There is no abdominal tenderness. There is no right CVA tenderness or left CVA tenderness.  Skin:    General: Skin is warm and dry.     Capillary Refill: Capillary refill takes less than 2 seconds.     Findings: No rash.  Neurological:     General: No focal deficit present.     Mental Status: He is alert. He is disoriented and confused.     GCS: GCS eye subscore is 4. GCS verbal subscore is 5. GCS  motor subscore is 6.     Cranial Nerves: No cranial nerve deficit, dysarthria or facial asymmetry.     Sensory: No sensory deficit.     Motor: No weakness, abnormal muscle tone or seizure activity.     Gait: Abnormal gait: gait initially defered due to multiple falls.  Psychiatric:        Mood and Affect: Mood normal.      ED Treatments / Results  Labs (all labs ordered are listed, but only abnormal results are displayed) Labs Reviewed  COMPREHENSIVE METABOLIC PANEL - Abnormal; Notable for the following components:      Result Value   Glucose, Bld 117 (*)    BUN 29 (*)    Creatinine, Ser 1.33 (*)    Albumin 2.5 (*)    Total Bilirubin 1.9 (*)    GFR calc non Af Amer 46 (*)    GFR calc Af Amer 53 (*)    All other components within normal limits  CBC WITH DIFFERENTIAL/PLATELET - Abnormal; Notable for the following components:   WBC 12.9 (*)    RBC 3.92 (*)    Hemoglobin 9.3 (*)    HCT 33.3 (*)    MCH 23.7 (*)    MCHC 27.9 (*)    RDW 19.3 (*)    Neutro Abs 9.4 (*)    Monocytes Absolute 2.4 (*)    Abs Immature Granulocytes 0.14 (*)    All other components within normal limits  CBG MONITORING, ED - Abnormal; Notable for the following components:   Glucose-Capillary 106 (*)    All other components within normal limits  SARS CORONAVIRUS 2 (HOSPITAL ORDER, PERFORMED IN [facility])  URINE CULTURE  LIPASE, BLOOD  AMMONIA  PROTIME-INR  MAGNESIUM  TSH  CK  TROPONIN I  URINALYSIS, ROUTINE W REFLEX MICROSCOPIC    EKG EKG Interpretation  Date/Time:  Saturday Sep 05 2018 11:51:04 EDT Ventricular Rate:  93 PR Interval:    QRS Duration: 89 QT Interval:  367 QTC Calculation: 457 R Axis:   -46 Text Interpretation:  Sinus rhythm Ventricular premature complex Aberrant conduction of SV complex(es) Left anterior fascicular block Abnormal R-wave progression, late transition No prior ECG for comparison.  No STEMI Confirmed by Theda Belfast ([id number]) on 09/05/2018  12:12:20 PM Also confirmed by Theda Belfast ([id number]), editor Barbette Hair [phone])  on 09/05/2018 12:15:03 PM   Radiology Dg Chest 2 View  Result Date: 09/05/2018 CLINICAL DATA:  Fall and altered mental status. EXAM: CHEST - 2 VIEW COMPARISON:  None FINDINGS: Two views of the chest demonstrate low lung volumes. Coarse lung markings may be chronic. Heart size is upper limits of normal and likely accentuated by low inspiratory effort. However, there may be an enlarged ascending thoracic aorta. Trachea is midline.  Negative for a pneumothorax. Bridging osteophytes in the mid and lower thoracic spine region. No large pleural effusions. IMPRESSION: No active cardiopulmonary disease. Concern for enlarged ascending thoracic aorta. Electronically Signed   By: Richarda Overlie M.D.   On: 09/05/2018 13:37   Ct Head Wo Contrast  Result Date: 09/05/2018 CLINICAL DATA:  85 year old with multiple falls. EXAM: CT HEAD WITHOUT CONTRAST CT CERVICAL SPINE WITHOUT CONTRAST TECHNIQUE: Multidetector CT imaging of the head and cervical spine was performed following the standard protocol without intravenous contrast. Multiplanar CT image reconstructions of the cervical spine were also generated. COMPARISON:  08/28/2012 FINDINGS: CT HEAD FINDINGS Brain: Diffuse cerebral atrophy. Extensive low density throughout the white matter and similar to the prior examination. No evidence for acute hemorrhage, mass lesion, midline shift, hydrocephalus or large infarct. Vascular: No hyperdense vessel or unexpected calcification. Skull: Normal. Negative for fracture or focal lesion. Sinuses/Orbits: Small amount of mucosal disease in the sphenoid sinuses. Other: None. CT CERVICAL SPINE FINDINGS Alignment: Normal. Skull base and vertebrae: No acute fracture. No primary bone lesion or focal pathologic process. Soft tissues and spinal canal: No prevertebral fluid or swelling. No visible canal hematoma. Disc levels: Mild disc space narrowing throughout the  cervical spine, most prominent at C3-C4. Facet arthropathy along the right side C2-C3. Upper chest: Negative. Other: None IMPRESSION: 1. No acute intracranial abnormality. 2. No acute bone abnormality in cervical spine. 3. Diffuse cerebral atrophy. Chronic white matter disease likely represents chronic small vessel ischemic changes. Electronically Signed   By: Richarda Overlie M.D.   On: 09/05/2018 14:22   Ct Cervical Spine Wo Contrast  Result Date: 09/05/2018 CLINICAL DATA:  85 year old with multiple falls. EXAM: CT HEAD WITHOUT CONTRAST CT CERVICAL SPINE WITHOUT CONTRAST TECHNIQUE: Multidetector CT imaging of the head and cervical spine was performed following the standard protocol without intravenous contrast. Multiplanar CT image reconstructions of the cervical spine were also generated. COMPARISON:  08/28/2012 FINDINGS: CT HEAD FINDINGS Brain: Diffuse cerebral atrophy. Extensive low density throughout the white matter and similar to the prior examination. No evidence for acute hemorrhage, mass lesion, midline shift, hydrocephalus or large infarct. Vascular: No hyperdense vessel or unexpected calcification. Skull: Normal. Negative for fracture or focal lesion. Sinuses/Orbits: Small amount of mucosal disease in the sphenoid sinuses. Other: None. CT CERVICAL SPINE FINDINGS Alignment: Normal. Skull base and vertebrae: No acute fracture. No primary bone lesion or focal pathologic process. Soft tissues and spinal canal: No prevertebral fluid or swelling. No visible canal hematoma. Disc levels: Mild disc space narrowing throughout the cervical spine, most prominent at C3-C4. Facet arthropathy along the right side C2-C3. Upper chest: Negative. Other: None IMPRESSION: 1. No acute intracranial abnormality. 2. No acute bone abnormality in cervical spine. 3. Diffuse cerebral atrophy. Chronic white matter disease likely represents chronic small vessel ischemic changes. Electronically Signed   By: Richarda Overlie M.D.   On:  09/05/2018 14:22    Procedures Procedures (including critical care time)  Medications Ordered in ED Medications  LORazepam (ATIVAN) injection 0.5 mg (has no administration in time range)  sodium chloride 0.9 % bolus 500 mL (500 mLs Intravenous New Bag/Given 09/05/18 1529)    Currie Paris Wacha was evaluated in Emergency Department on 09/05/2018 for the symptoms described in the history of present illness. He was evaluated in the context of the global COVID-19 pandemic, which necessitated consideration that the patient might be at risk for infection with the SARS-CoV-2 virus that causes COVID-19. Institutional protocols and algorithms that pertain to the evaluation  of patients at risk for COVID-19 are in a state of rapid change based on information released by regulatory bodies including the CDC and federal and state organizations. These policies and algorithms were followed during the patient's care in the ED.    Initial Impression / Assessment and Plan / ED Course  I have reviewed the triage vital signs and the nursing notes.  Pertinent labs & imaging results that were available during my care of the patient were reviewed by me and considered in my medical decision making (see chart for details).        MAXXON SCHWANKE is a 85 y.o. male with minimal past medical history who presents for multiple falls and concern for altered mental status.  Patient has reportedly had several falls over the last few days.  Patient does report he had a headache earlier but it has improved.  He reports feeling well currently but is disoriented.  According to EMS, patient is usually alert and oriented x4.  Patient currently thinks it is 18.  Patient reports no fevers, chills, cough, chest palpitations, nausea, vomiting, urinary symptoms, GI symptoms, numbness, tingling, or weakness of extremities.  He denies any current pain on arrival.    On exam, patient has clear lungs.  Abdomen is nontender, back is nontender.   Patient moving all extremities.  Patient has mild edema in the legs.  Abrasion seen on the legs.  Patient moving all extremities.  Normal sensation and strength in all extremities.  Normal pulses in extremities.  Face was symmetric.  Normal sensation throughout.  Normal extraocular movements and pupil exam.  Neck nontender.  Given patient's report of multiple falls with previous headache, will obtain head and neck CT given his disoriented status.  He will have screening laboratory testing to look for occult infection or other abnormalities may have contributed to his unsteadiness.  Anticipate reassessment after work-up.  1:05 PM Nursing spoke with family who brought him information.  According to family, patient lives alone and has had multiple falls over the last few days.  He was found on the ground twice yesterday and today with furniture moves around.  Patient does not remember all of this.  Concerned that multiple falls with altered mental status not being at his baseline.  Patient has not been able to urinate for [place].  Will do in and out cath to look for UTI is contributing to multiple falls.  Patient's labs were also began to return and were overall reassuring.  Mild leukocytosis and anemia.  INR 1.1.  Ammonia not elevated.  Magnesium normal.  TSH normal.  CK not elevated.  Troponin negative.  CT head and neck showed no acute intracranial or bony abnormalities.  Cerebral atrophy seen.  Chest x-ray shows no acute cardiopulmonary disease although there is concern for a slightly enlarged descending thoracic aorta.  Given lack of chest pain shortness of breath palpitations, low suspicion for an aortic etiology of his symptoms today.  COVID test is negative.  Patient was unable to make urine with the condom cath.  In and out catheter will be used.  Due to the patient's multiple falls, living alone, and altered mental status compared to his baseline, anticipate admission.  Will treat for UTI if it is  discovered.  Care transferred to oncoming team while awaiting full diagnostic labs.  Anticipate admission.   Final Clinical Impressions(s) / ED Diagnoses   Final diagnoses:  Multiple falls  Confusion     Clinical Impression: 1. Multiple  falls   2. Confusion     Disposition: Awaiting results of urinalysis and anticipate admission for altered mental status and multiple falls living alone.  This note was prepared with assistance of Conservation officer, historic buildings. Occasional wrong-word or sound-a-like substitutions may have occurred due to the inherent limitations of voice recognition software.      Lumina Gitto, Canary Brim, MD 09/05/18 [phone]

## 2018-09-05 NOTE — ED Notes (Signed)
Pt more altered, trying to get up, ripping off leads, having conversations w/ people who are not present in room - Dr. Jodi Mourning notified

## 2018-09-05 NOTE — ED Notes (Signed)
Per Dr. Jomarie Longs, will finish initial dose of rocephin; Pharmacy notified; will continue to monitor

## 2018-09-05 NOTE — ED Notes (Signed)
Condom cath placed.

## 2018-09-05 NOTE — ED Notes (Signed)
Redness noted around IV site while rocephin infusing; IV still in place, able to pull back blood, no infiltration; infusion stopped, pharmacy, MD notified; no other distress noted; will continue to monitor

## 2018-09-05 NOTE — ED Triage Notes (Signed)
Pt brought in by [facility] from home for multiple falls over the past several days and AMS. Pt A+Ox4 at baseline, currently is only oriented to self. Pt denies pain, no obvious injuries. Pt does not see a PCP. Pt daughter [name] [phone]. Pt skin warm and dry.

## 2018-09-06 DIAGNOSIS — N179 Acute kidney failure, unspecified: Secondary | ICD-10-CM

## 2018-09-06 LAB — CBC
HCT: 28.8 % — ABNORMAL LOW (ref 39.0–52.0)
Hemoglobin: 7.9 g/dL — ABNORMAL LOW (ref 13.0–17.0)
MCH: 23.7 pg — ABNORMAL LOW (ref 26.0–34.0)
MCHC: 27.4 g/dL — ABNORMAL LOW (ref 30.0–36.0)
MCV: 86.2 fL (ref 80.0–100.0)
Platelets: 214 10*3/uL (ref 150–400)
RBC: 3.34 MIL/uL — ABNORMAL LOW (ref 4.22–5.81)
RDW: 19.2 % — ABNORMAL HIGH (ref 11.5–15.5)
WBC: 8.1 10*3/uL (ref 4.0–10.5)
nRBC: 0 % (ref 0.0–0.2)

## 2018-09-06 LAB — BASIC METABOLIC PANEL
Anion gap: 11 (ref 5–15)
BUN: 22 mg/dL (ref 8–23)
CO2: 23 mmol/L (ref 22–32)
Calcium: 7.8 mg/dL — ABNORMAL LOW (ref 8.9–10.3)
Chloride: 105 mmol/L (ref 98–111)
Creatinine, Ser: 0.89 mg/dL (ref 0.61–1.24)
GFR calc Af Amer: >60 mL/min (ref >60)
GFR calc non Af Amer: >60 mL/min (ref >60)
Glucose, Bld: 87 mg/dL (ref 70–99)
Potassium: 3.7 mmol/L (ref 3.5–5.1)
Sodium: 139 mmol/L (ref 135–145)

## 2018-09-06 NOTE — Evaluation (Signed)
Occupational Therapy Evaluation Patient Details Name: Arthur Sims MRN: [id number] DOB: 04-15-1925 Today's Date: 09/06/2018    History of Present Illness Pt is a 85 y.o. male with no significant past medical history, patient has not been to a doctor in over 10 years, does not take any medications on a regular basis. Presents with increased confusion and multiple falls over the last few days. Found with UTI.    Clinical Impression   Patient admitted for above, limited by problem list below including impaired cognition, impaired balance, decreased activity tolerance and generalized weakness.  Daughter reports pt lives alone, limited mobility and refused to use RW/cane, required assist with bathing and dressing due to initiation to complete tasks. Patient oriented to self only, inconsistently follows 1 step commands.  Able to complete grooming with min assist, bed mobility with min-mod assist, sit to stand transfers with mod assist +2 (posterior lean), mod assist with UB ADLs and total assist for LB ADLs.  Patient will benefit from continued OT services while admitted and after dc at SNF level in order to optimize independence and safety with ADLs/mobility.  Daughter agrees with SNF recommendation.      Follow Up Recommendations  SNF;Supervision/Assistance - 24 hour    Equipment Recommendations  Other (comment)(TBD at next venue of care)    Recommendations for Other Services       Precautions / Restrictions Precautions Precautions: Fall Restrictions Weight Bearing Restrictions: No      Mobility Bed Mobility Overal bed mobility: Needs Assistance Bed Mobility: Supine to Sit;Sit to Supine     Supine to sit: Mod assist;HOB elevated Sit to supine: Min assist   General bed mobility comments: mod assist for LEs and trunk support, transitioend to supine with min assist for safety   Transfers Overall transfer level: Needs assistance Equipment used: 2 person hand held  assist Transfers: Sit to/from Stand Sit to Stand: Mod assist;+2 safety/equipment;+2 physical assistance         General transfer comment: mod assist +2 to power up into standing with posterior lean     Balance Overall balance assessment: Needs assistance;History of Falls Sitting-balance support: No upper extremity supported;Feet supported Sitting balance-Leahy Scale: Fair     Standing balance support: Bilateral upper extremity supported;During functional activity Standing balance-Leahy Scale: Poor Standing balance comment: posterior lean                           ADL either performed or assessed with clinical judgement   ADL Overall ADL's : Needs assistance/impaired Eating/Feeding: Minimal assistance;Sitting   Grooming: Minimal assistance;Sitting   Upper Body Bathing: Moderate assistance;Sitting   Lower Body Bathing: Total assistance;Sit to/from stand;+2 for physical assistance   Upper Body Dressing : Moderate assistance;Sitting   Lower Body Dressing: Total assistance;+2 for physical assistance;Sit to/from Market researcher Details (indicate cue type and reason): deferred due to safety         Functional mobility during ADLs: Moderate assistance;+2 for physical assistance;+2 for safety/equipment General ADL Comments: pt limited by cognition, generalized weakness and poor balance     Vision   Additional Comments: NT- further assessment     Perception     Praxis      Pertinent Vitals/Pain Pain Assessment: Faces Faces Pain Scale: No hurt     Hand Dominance     Extremity/Trunk Assessment Upper Extremity Assessment Upper Extremity Assessment: Generalized weakness   Lower Extremity Assessment Lower Extremity Assessment: Defer  to PT evaluation       Communication     Cognition Arousal/Alertness: Awake/alert Behavior During Therapy: Flat affect Overall Cognitive Status: Impaired/Different from baseline Area of Impairment:  Orientation;Attention;Memory;Following commands;Safety/judgement;Awareness;Problem solving                 Orientation Level: Disoriented to;Place;Time;Situation Current Attention Level: Focused Memory: Decreased recall of precautions;Decreased short-term memory Following Commands: Follows one step commands inconsistently Safety/Judgement: Decreased awareness of deficits;Decreased awareness of safety Awareness: Intellectual Problem Solving: Slow processing;Decreased initiation;Difficulty sequencing;Requires verbal cues;Requires tactile cues General Comments: hx of cognitive decline   General Comments       Exercises     Shoulder Instructions      Home Living Family/patient expects to be discharged to:: Private residence Living Arrangements: Alone Available Help at Discharge: Family;Available PRN/intermittently                         Home Equipment: Walker - 2 wheels;Cane - single point   Additional Comments: pt unreliable historian, spoke to his daughter who reports he lives alone and she visits 2-3x/ day to assist       Prior Functioning/Environment Level of Independence: Needs assistance  Gait / Transfers Assistance Needed: daugther reports pt refuses to use walker/cane, limited mobility at home  ADL's / Homemaking Assistance Needed: daughter reports pt requires total assist for bathing, a little help to dress but she has to lay his clothes out             OT Problem List: Decreased strength;Decreased activity tolerance;Decreased knowledge of precautions;Decreased knowledge of use of DME or AE;Decreased safety awareness;Decreased cognition;Decreased coordination;Impaired balance (sitting and/or standing)      OT Treatment/Interventions: Self-care/ADL training;Therapeutic exercise;DME and/or AE instruction;Therapeutic activities;Patient/family education;Balance training    OT Goals(Current goals can be found in the care plan section) Acute Rehab OT  Goals Patient Stated Goal: none stated OT Goal Formulation: Patient unable to participate in goal setting Time For Goal Achievement: 09/20/18 Potential to Achieve Goals: Fair  OT Frequency: Min 2X/week   Barriers to D/C: Decreased caregiver support          Co-evaluation PT/OT/SLP Co-Evaluation/Treatment: Yes Reason for Co-Treatment: Necessary to address cognition/behavior during functional activity;For patient/therapist safety;To address functional/ADL transfers   OT goals addressed during session: ADL's and self-care      AM-PAC OT "6 Clicks" Daily Activity     Outcome Measure Help from another person eating meals?: A Little Help from another person taking care of personal grooming?: A Little Help from another person toileting, which includes using toliet, bedpan, or urinal?: Total Help from another person bathing (including washing, rinsing, drying)?: A Lot Help from another person to put on and taking off regular upper body clothing?: A Lot Help from another person to put on and taking off regular lower body clothing?: A Lot 6 Click Score: 13   End of Session Equipment Utilized During Treatment: Gait belt Nurse Communication: Mobility status  Activity Tolerance: Patient tolerated treatment well Patient left: in bed;with call bell/phone within reach;with bed alarm set  OT Visit Diagnosis: Other abnormalities of gait and mobility (R26.89);Other symptoms and signs involving cognitive function;Repeated falls (R29.6)                Time: 1400-1411 OT Time Calculation (min): 11 min Charges:  OT General Charges $OT Visit: 1 Visit OT Evaluation $OT Eval Moderate Complexity: 1 Mod  Chancy Milroyhristie S Anasofia Micallef, OT Acute Rehabilitation Services Pager [phone] Office  [phone]   Chancy Milroy 09/06/2018, 3:45 PM

## 2018-09-06 NOTE — Evaluation (Signed)
Physical Therapy Evaluation Patient Details Name: Arthur Sims MRN: [id number] DOB: 1925/04/10 Today's Date: 09/06/2018   History of Present Illness  Pt is a 85 y.o. male with no significant past medical history, patient has not been to a doctor in over 10 years, does not take any medications on a regular basis. Presents with increased confusion and multiple falls over the last few days. Found with UTI. \   Clinical Impression  Pt admitted with above diagnosis. Pt currently with functional limitations due to the deficits listed below (see PT Problem List). PTA, pt living alone. Unreliable historian. AO to person only. Today, posterior lean in standing, mod A to assist for stability. Unsafe to return home, will need sub acute rehab with longer term options to be figured out from there.  Pt will benefit from skilled PT to increase their independence and safety with mobility to allow discharge to the venue listed below.       Follow Up Recommendations SNF    Equipment Recommendations  (TBD )    Recommendations for Other Services       Precautions / Restrictions Precautions Precautions: Fall Restrictions Weight Bearing Restrictions: No      Mobility  Bed Mobility Overal bed mobility: Needs Assistance Bed Mobility: Supine to Sit;Sit to Supine     Supine to sit: Mod assist;HOB elevated Sit to supine: Min assist   General bed mobility comments: mod assist for LEs and trunk support, transitioend to supine with min assist for safety   Transfers Overall transfer level: Needs assistance Equipment used: 2 person hand held assist Transfers: Sit to/from Stand Sit to Stand: Mod assist;+2 safety/equipment;+2 physical assistance         General transfer comment: mod assist +2 to power up into standing with posterior lean   Ambulation/Gait                Stairs            Wheelchair Mobility    Modified Rankin (Stroke Patients Only)       Balance Overall  balance assessment: Needs assistance;History of Falls Sitting-balance support: No upper extremity supported;Feet supported Sitting balance-Leahy Scale: Fair     Standing balance support: Bilateral upper extremity supported;During functional activity Standing balance-Leahy Scale: Poor Standing balance comment: posterior lean                             Pertinent Vitals/Pain Pain Assessment: Faces Faces Pain Scale: No hurt    Home Living Family/patient expects to be discharged to:: Private residence Living Arrangements: Alone Available Help at Discharge: Family;Available PRN/intermittently           Home Equipment: Walker - 2 wheels;Cane - single point Additional Comments: pt unreliable historian, spoke to his daughter who reports he lives alone and she visits 2-3x/ day to assist     Prior Function Level of Independence: Needs assistance   Gait / Transfers Assistance Needed: daugther reports pt refuses to use walker/cane, limited mobility at home   ADL's / Homemaking Assistance Needed: daughter reports pt requires total assist for bathing, a little help to dress but she has to lay his clothes out         Hand Dominance        Extremity/Trunk Assessment   Upper Extremity Assessment Upper Extremity Assessment: Generalized weakness    Lower Extremity Assessment Lower Extremity Assessment: Defer to PT evaluation  Communication      Cognition Arousal/Alertness: Awake/alert Behavior During Therapy: Flat affect Overall Cognitive Status: Impaired/Different from baseline Area of Impairment: Orientation;Attention;Memory;Following commands;Safety/judgement;Awareness;Problem solving                 Orientation Level: Disoriented to;Place;Time;Situation Current Attention Level: Focused Memory: Decreased recall of precautions;Decreased short-term memory Following Commands: Follows one step commands inconsistently Safety/Judgement: Decreased  awareness of deficits;Decreased awareness of safety Awareness: Intellectual Problem Solving: Slow processing;Decreased initiation;Difficulty sequencing;Requires verbal cues;Requires tactile cues General Comments: hx of cognitive decline      General Comments      Exercises     Assessment/Plan    PT Assessment Patient needs continued PT services  PT Problem List Decreased strength       PT Treatment Interventions DME instruction;Gait training;Stair training;Functional mobility training;Therapeutic exercise;Balance training;Therapeutic activities    PT Goals (Current goals can be found in the Care Plan section)  Acute Rehab PT Goals Patient Stated Goal: none stated PT Goal Formulation: With patient Potential to Achieve Goals: Good    Frequency Min 2X/week   Barriers to discharge Decreased caregiver support      Co-evaluation   Reason for Co-Treatment: Necessary to address cognition/behavior during functional activity;For patient/therapist safety;To address functional/ADL transfers   OT goals addressed during session: ADL's and self-care       AM-PAC PT "6 Clicks" Mobility  Outcome Measure Help needed turning from your back to your side while in a flat bed without using bedrails?: A Little Help needed moving from lying on your back to sitting on the side of a flat bed without using bedrails?: A Little Help needed moving to and from a bed to a chair (including a wheelchair)?: A Little Help needed standing up from a chair using your arms (e.g., wheelchair or bedside chair)?: A Lot Help needed to walk in hospital room?: A Lot Help needed climbing 3-5 steps with a railing? : A Lot 6 Click Score: 15    End of Session Equipment Utilized During Treatment: Gait belt Activity Tolerance: Patient tolerated treatment well Patient left: in bed Nurse Communication: Mobility status PT Visit Diagnosis: Unsteadiness on feet (R26.81)    Time: 1400-1410 PT Time Calculation (min)  (ACUTE ONLY): 10 min   Charges:   PT Evaluation $PT Eval Moderate Complexity: 1 Mod          Etta GrandchildSean Andretta Ergle, PT, DPT Acute Rehabilitation Services Pager: [phone] Office: [phone]    Etta GrandchildSean Ethylene Reznick 09/06/2018, 4:56 PM

## 2018-09-06 NOTE — Progress Notes (Signed)
PROGRESS NOTE    Arthur Sims  WUJ:[id number] DOB: Sep 11, 1924 DOA: 09/05/2018 PCP: Tomi Bamberger, NP (Inactive)    Brief Narrative:   Arthur Sims is a 85 y.o. male with no significant past medical history, patient has not been to a doctor in over 10 years, does not take any medications on a regular basis.  He lives by himself but has a daughter that checks on him frequently she reports mild memory and cognitive decline for months to a year. Over the last 3 to 4 days patient has been more confused than usual, daughter reports multiple falls as well over the last 3 days, on Thursday EMS was called following a fall but patient refused to come to the ED for further evaluation subsequently he fell couple more times he denies any pain anywhere, has been more agitated and confused over the last couple of days which is why his daughter had him brought to the emergency room today  ED Course: Vital signs stable, labs notable for creatinine of 1.3, white count of 12.9, hemoglobin of 9.3, abnormal UA with positive nitrite, leuk esterase, many bacteria and 6-10 WBCs, CT head noted diffuse atrophy, no acute abnormalities, SARS COVID-19 PCR was negative. Patient was agitated in the emergency room was given Ativan IV x1 however continues to be angry agitated and confused   Assessment & Plan:   Active Problems:   Acute metabolic encephalopathy   Sepsis secondary to UTI (HCC)   Sepsis, present on admission Urinary tract infection Patient presenting with worsening confusion over the last 3-4 days.  Noted to have elevated WBC count of 12.9, temperature of 97.4.  Urinalysis notable for small leukoesterase with positive nitrite and 6-10 WBCs.  Urine culture with greater than 100 K GNR's.  COVID-19 negative. Chest x-ray with no acute cardiopulmonary disease process. --Continue empiric antibiotics with ceftriaxone 1 g IV every 24 hours --Await further urine culture identification and susceptibilities  --Supportive care  Acute metabolic encephalopathy Patient presenting with confusion, no significant past medical history and not on any medications.  Multiple falls over the last few days per family.  Increased agitation.  Noted to have UTI as above.  CT head/neck with diffuse atrophy otherwise unrevealing.  Troponins less than 0.03.  TSH within normal limits, ammonia level normal.  Daughter reports that his confusion has been progressing over the past few years, but more acutely over the last few days likely from infection as above.  Patient also likely has underlying dementia. 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  Acute renal failure Unknown baseline, has not been to a physician in over 10 years.  Creatinine on admission 1.33 --Cr 1.33-->0.89 --Continue gentle IV fluid hydration with NS at 75 mL's per hour --Repeat BMP in a.m.   DVT prophylaxis: Lovenox Code Status: DNR Family Communication: Discussed with patient's daughter Gavin Pound over the telephone today Disposition Plan: Inpatient, pending therapy evaluation, likely will need SNF versus assisted living versus more supervision at discharge given his likely underlying dementia.   Consultants:   none  Procedures:   none  Antimicrobials:  Ceftriaxone 5/16>>   Subjective: Patient seen and examined at bedside, pleasantly confused.  Appears to be in good spirits this morning.  No specific complaints.  Denies headache, no chest pain, no shortness of breath, no palpitations, no  abdominal pain, no nausea/vomiting/diarrhea, no fever/chills/night sweats.  No acute events overnight per nursing staff.  Objective: Vitals:   09/05/18 1700 09/05/18 1730 09/06/18 0535 09/06/18 0911  BP: 113/78 102/62 114/78 103/61  Pulse: 83  80 80 85  Resp: (!) 22  18 (!) 21  Temp:  (!) 97.4 F (36.3 C) 98 F (36.7 C) 97.7 F (36.5 C)  TempSrc:  Axillary Oral Oral  SpO2: 94% 97% 98% 94%    Intake/Output Summary (Last 24 hours) at 09/06/2018 1230 Last data filed at 09/06/2018 0853 Gross per 24 hour  Intake 1384.37 ml  Output 50 ml  Net 1334.37 ml   There were no vitals filed for this visit.  Examination:  General exam: Appears calm and comfortable, pleasantly confused Respiratory system: Clear to auscultation. Respiratory effort normal. Cardiovascular system: S1 & S2 heard, RRR. No JVD, murmurs, rubs, gallops or clicks. No pedal edema. Gastrointestinal system: Abdomen is nondistended, soft and nontender. No organomegaly or masses felt. Normal bowel sounds heard. Central nervous system: Alert and oriented. No focal neurological deficits. Extremities: Symmetric 5 x 5 power. Skin: No rashes, lesions or ulcers Psychiatry: Alert to place (Hospital/[facility]), not to person/time    Data Reviewed: I have personally reviewed following labs and imaging studies  CBC: Recent Labs  Lab 09/05/18 1204 09/06/18 0556  WBC 12.9* 8.1  NEUTROABS 9.4*  --   HGB 9.3* 7.9*  HCT 33.3* 28.8*  MCV 84.9 86.2  PLT 251 214   Basic Metabolic Panel: Recent Labs  Lab 09/05/18 1204 09/06/18 0556  NA 136 139  K 4.1 3.7  CL 105 105  CO2 23 23  GLUCOSE 117* 87  BUN 29* 22  CREATININE 1.33* 0.89  CALCIUM 8.9 7.8*  MG 2.1  --    GFR: CrCl cannot be calculated (Unknown ideal weight.). Liver Function Tests: Recent Labs  Lab 09/05/18 1204  AST 19  ALT 13  ALKPHOS 102  BILITOT 1.9*  PROT 6.8  ALBUMIN 2.5*   Recent Labs  Lab 09/05/18 1204  LIPASE 21   Recent Labs  Lab 09/05/18 1204  AMMONIA 11   Coagulation Profile: Recent Labs  Lab 09/05/18 1204  INR 1.1   Cardiac Enzymes: Recent Labs  Lab 09/05/18 1204  CKTOTAL 116  TROPONINI <0.03   BNP (last 3 results) No results for input(s): PROBNP in the last 8760  hours. HbA1C: No results for input(s): HGBA1C in the last 72 hours. CBG: Recent Labs  Lab 09/05/18 1153  GLUCAP 106*   Lipid Profile: No results for input(s): CHOL, HDL, LDLCALC, TRIG, CHOLHDL, LDLDIRECT in the last 72 hours. Thyroid Function Tests: Recent Labs    09/05/18 1204  TSH 3.410   Anemia Panel: Recent Labs    09/05/18 1204  VITAMINB12 488  FOLATE 12.1  FERRITIN 21*  TIBC 266  IRON 16*  RETICCTPCT 1.7   Sepsis Labs: No results for input(s): PROCALCITON, LATICACIDVEN in the last 168 hours.  Recent Results (from the past 240 hour(s))  SARS Coronavirus 2 (CEPHEID - Performed in [facility] hospital lab), Hosp Order     Status: None   Collection Time: 09/05/18  1:10 PM  Result Value Ref Range Status   SARS Coronavirus 2 NEGATIVE NEGATIVE Final    Comment: (NOTE) If result is NEGATIVE SARS-CoV-2 target nucleic acids are NOT DETECTED. The SARS-CoV-2 RNA is generally detectable in upper and lower  respiratory specimens during the acute phase of infection. The lowest  concentration of SARS-CoV-2 viral copies this  assay can detect is 250  copies / mL. A negative result does not preclude SARS-CoV-2 infection  and should not be used as the sole basis for treatment or other  patient management decisions.  A negative result may occur with  improper specimen collection / handling, submission of specimen other  than nasopharyngeal swab, presence of viral mutation(s) within the  areas targeted by this assay, and inadequate number of viral copies  (<250 copies / mL). A negative result must be combined with clinical  observations, patient history, and epidemiological information. If result is POSITIVE SARS-CoV-2 target nucleic acids are DETECTED. The SARS-CoV-2 RNA is generally detectable in upper and lower  respiratory specimens dur ing the acute phase of infection.  Positive  results are indicative of active infection with SARS-CoV-2.  Clinical  correlation with  patient history and other diagnostic information is  necessary to determine patient infection status.  Positive results do  not rule out bacterial infection or co-infection with other viruses. If result is PRESUMPTIVE POSTIVE SARS-CoV-2 nucleic acids MAY BE PRESENT.   A presumptive positive result was obtained on the submitted specimen  and confirmed on repeat testing.  While 2019 novel coronavirus  (SARS-CoV-2) nucleic acids may be present in the submitted sample  additional confirmatory testing may be necessary for epidemiological  and / or clinical management purposes  to differentiate between  SARS-CoV-2 and other Sarbecovirus currently known to infect humans.  If clinically indicated additional testing with an alternate test  methodology [id number]) is advised. The SARS-CoV-2 RNA is generally  detectable in upper and lower respiratory sp ecimens during the acute  phase of infection. The expected result is Negative. Fact Sheet for Patients:  BoilerBrush.com.cy Fact Sheet for Healthcare Providers: https://pope.com/ This test is not yet approved or cleared by the [place] FDA and has been authorized for detection and/or diagnosis of SARS-CoV-2 by FDA under an Emergency Use Authorization (EUA).  This EUA will remain in effect (meaning this test can be used) for the duration of the COVID-19 declaration under Section 564(b)(1) of the Act, 21 U.S.C. section 360bbb-3(b)(1), unless the authorization is terminated or revoked sooner. Performed at [facility], [address] [place], [place] [address]   Urine culture     Status: Abnormal (Preliminary result)   Collection Time: 09/05/18  2:40 PM  Result Value Ref Range Status   Specimen Description URINE, CATHETERIZED  Final   Special Requests   Final    NONE Performed at [facility], [address], [place], [place] [address]    Culture >=100,000 COLONIES/mL GRAM NEGATIVE  RODS (A)  Final   Report Status PENDING  Incomplete         Radiology Studies: Dg Chest 2 View  Result Date: 09/05/2018 CLINICAL DATA:  Fall and altered mental status. EXAM: CHEST - 2 VIEW COMPARISON:  None FINDINGS: Two views of the chest demonstrate low lung volumes. Coarse lung markings may be chronic. Heart size is upper limits of normal and likely accentuated by low inspiratory effort. However, there may be an enlarged ascending thoracic aorta. Trachea is midline. Negative for a pneumothorax. Bridging osteophytes in the mid and lower thoracic spine region. No large pleural effusions. IMPRESSION: No active cardiopulmonary disease. Concern for enlarged ascending thoracic aorta. Electronically Signed   By: Richarda Overlie M.D.   On: 09/05/2018 13:37   Ct Head Wo Contrast  Result Date: 09/05/2018 CLINICAL DATA:  85 year old with multiple falls. EXAM: CT HEAD WITHOUT CONTRAST CT  CERVICAL SPINE WITHOUT CONTRAST TECHNIQUE: Multidetector CT imaging of the head and cervical spine was performed following the standard protocol without intravenous contrast. Multiplanar CT image reconstructions of the cervical spine were also generated. COMPARISON:  08/28/2012 FINDINGS: CT HEAD FINDINGS Brain: Diffuse cerebral atrophy. Extensive low density throughout the white matter and similar to the prior examination. No evidence for acute hemorrhage, mass lesion, midline shift, hydrocephalus or large infarct. Vascular: No hyperdense vessel or unexpected calcification. Skull: Normal. Negative for fracture or focal lesion. Sinuses/Orbits: Small amount of mucosal disease in the sphenoid sinuses. Other: None. CT CERVICAL SPINE FINDINGS Alignment: Normal. Skull base and vertebrae: No acute fracture. No primary bone lesion or focal pathologic process. Soft tissues and spinal canal: No prevertebral fluid or swelling. No visible canal hematoma. Disc levels: Mild disc space narrowing throughout the cervical spine, most prominent at  C3-C4. Facet arthropathy along the right side C2-C3. Upper chest: Negative. Other: None IMPRESSION: 1. No acute intracranial abnormality. 2. No acute bone abnormality in cervical spine. 3. Diffuse cerebral atrophy. Chronic white matter disease likely represents chronic small vessel ischemic changes. Electronically Signed   By: Richarda OverlieAdam  Henn M.D.   On: 09/05/2018 14:22   Ct Cervical Spine Wo Contrast  Result Date: 09/05/2018 CLINICAL DATA:  85 year old with multiple falls. EXAM: CT HEAD WITHOUT CONTRAST CT CERVICAL SPINE WITHOUT CONTRAST TECHNIQUE: Multidetector CT imaging of the head and cervical spine was performed following the standard protocol without intravenous contrast. Multiplanar CT image reconstructions of the cervical spine were also generated. COMPARISON:  08/28/2012 FINDINGS: CT HEAD FINDINGS Brain: Diffuse cerebral atrophy. Extensive low density throughout the white matter and similar to the prior examination. No evidence for acute hemorrhage, mass lesion, midline shift, hydrocephalus or large infarct. Vascular: No hyperdense vessel or unexpected calcification. Skull: Normal. Negative for fracture or focal lesion. Sinuses/Orbits: Small amount of mucosal disease in the sphenoid sinuses. Other: None. CT CERVICAL SPINE FINDINGS Alignment: Normal. Skull base and vertebrae: No acute fracture. No primary bone lesion or focal pathologic process. Soft tissues and spinal canal: No prevertebral fluid or swelling. No visible canal hematoma. Disc levels: Mild disc space narrowing throughout the cervical spine, most prominent at C3-C4. Facet arthropathy along the right side C2-C3. Upper chest: Negative. Other: None IMPRESSION: 1. No acute intracranial abnormality. 2. No acute bone abnormality in cervical spine. 3. Diffuse cerebral atrophy. Chronic white matter disease likely represents chronic small vessel ischemic changes. Electronically Signed   By: Richarda OverlieAdam  Henn M.D.   On: 09/05/2018 14:22        Scheduled  Meds: . enoxaparin (LOVENOX) injection  40 mg Subcutaneous Q24H   Continuous Infusions: . sodium chloride 75 mL/hr at 09/06/18 0925  . cefTRIAXone (ROCEPHIN)  IV       LOS: 1 day    Time spent: 31 minutes    Alvira PhilipsEric J [place], DO [facility] Pager [phone]  If 7PM-7AM, please contact night-coverage [email or web address] Password [email or web address] 09/06/2018, 12:30 PM

## 2018-09-07 DIAGNOSIS — F0390 Unspecified dementia without behavioral disturbance: Secondary | ICD-10-CM

## 2018-09-07 LAB — URINE CULTURE: Culture: >=100000 — AB

## 2018-09-07 LAB — CBC
HCT: 29.8 % — ABNORMAL LOW (ref 39.0–52.0)
Hemoglobin: 8.2 g/dL — ABNORMAL LOW (ref 13.0–17.0)
MCH: 23.5 pg — ABNORMAL LOW (ref 26.0–34.0)
MCHC: 27.5 g/dL — ABNORMAL LOW (ref 30.0–36.0)
MCV: 85.4 fL (ref 80.0–100.0)
Platelets: 219 10*3/uL (ref 150–400)
RBC: 3.49 MIL/uL — ABNORMAL LOW (ref 4.22–5.81)
RDW: 19.4 % — ABNORMAL HIGH (ref 11.5–15.5)
WBC: 8.5 10*3/uL (ref 4.0–10.5)
nRBC: 0 % (ref 0.0–0.2)

## 2018-09-07 LAB — BASIC METABOLIC PANEL
Anion gap: 7 (ref 5–15)
BUN: 18 mg/dL (ref 8–23)
CO2: 23 mmol/L (ref 22–32)
Calcium: 7.8 mg/dL — ABNORMAL LOW (ref 8.9–10.3)
Chloride: 108 mmol/L (ref 98–111)
Creatinine, Ser: 0.94 mg/dL (ref 0.61–1.24)
GFR calc Af Amer: >60 mL/min (ref >60)
GFR calc non Af Amer: >60 mL/min (ref >60)
Glucose, Bld: 95 mg/dL (ref 70–99)
Potassium: 3.6 mmol/L (ref 3.5–5.1)
Sodium: 138 mmol/L (ref 135–145)

## 2018-09-07 MED ORDER — CIPROFLOXACIN HCL 500 MG PO TABS: 500.0000 mg | ORAL_TABLET | Freq: Two times a day (BID) | ORAL | Status: DC

## 2018-09-07 MED ORDER — CEPHALEXIN 500 MG PO CAPS
500.0000 mg | ORAL_CAPSULE | Freq: Two times a day (BID) | ORAL | Status: AC
  Administered 2018-09-07 – 2018-09-09 (×6): 500 mg via ORAL
  Filled 2018-09-07 (×6): qty 1

## 2018-09-07 NOTE — Progress Notes (Signed)
PROGRESS NOTE    Arthur Sims  ZOX:[id number] DOB: 1924/08/10 DOA: 09/05/2018 PCP: Tomi Bamberger, NP (Inactive)    Brief Narrative:   Arthur Sims is a 85 y.o. male with no significant past medical history, patient has not been to a doctor in over 10 years, does not take any medications on a regular basis.  He lives by himself but has a daughter that checks on him frequently she reports mild memory and cognitive decline for months to a year. Over the last 3 to 4 days patient has been more confused than usual, daughter reports multiple falls as well over the last 3 days, on Thursday EMS was called following a fall but patient refused to come to the ED for further evaluation subsequently he fell couple more times he denies any pain anywhere, has been more agitated and confused over the last couple of days which is why his daughter had him brought to the emergency room today  ED Course: Vital signs stable, labs notable for creatinine of 1.3, white count of 12.9, hemoglobin of 9.3, abnormal UA with positive nitrite, leuk esterase, many bacteria and 6-10 WBCs, CT head noted diffuse atrophy, no acute abnormalities, SARS COVID-19 PCR was negative. Patient was agitated in the emergency room was given Ativan IV x1 however continues to be angry agitated and confused   Assessment & Plan:   Active Problems:   Acute metabolic encephalopathy   Sepsis secondary to UTI (HCC)   Sepsis, present on admission Klebsiella pneumonia urinary tract infection Patient presenting with worsening confusion over the last 3-4 days.  Noted to have elevated WBC count of 12.9, temperature of 97.4.  Urinalysis notable for small leukoesterase with positive nitrite and 6-10 WBCs.  Urine culture with greater than 100 K GNR's.  COVID-19 negative. Chest x-ray with no acute cardiopulmonary disease process. --De-escalate from ceftriaxone to ciprofloxacin 500 mg p.o. twice daily, to complete 5-day course based on urine culture  identification and susceptibilities. --Supportive care  Acute metabolic encephalopathy Patient presenting with confusion, no significant past medical history and not on any medications.  Multiple falls over the last few days per family.  Increased agitation.  Noted to have UTI as above.  CT head/neck with diffuse atrophy otherwise unrevealing.  Troponins less than 0.03.  TSH within normal limits, ammonia level normal.  Daughter reports that his confusion has been progressing over the past few years, but more acutely over the last few days likely from infection as above.  Patient also likely has underlying dementia. 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 --Case management for coordination of placement  Acute renal failure Unknown baseline, has not been to a physician in over 10 years.  Creatinine on admission 1.33 --Cr 1.33-->0.89 --Continue gentle IV fluid hydration with NS at 75 mL's per hour --Repeat BMP in a.m.   DVT prophylaxis: Lovenox Code Status: DNR Family Communication: Discussed with patient's daughter Arthur Sims over the telephone today Disposition Plan: PT/OT recommends SNF, case management for coordination   Consultants:   none  Procedures:   none  Antimicrobials:  Ceftriaxone 5/16 - 5/17  Ciprofloxacin 5/18>>>   Subjective: Patient seen and examined at bedside, pleasantly confused.  Appears to be in good spirits this morning.  No specific complaints.  Denies headache, no chest pain, no shortness of breath,  no palpitations, no abdominal pain, no nausea/vomiting/diarrhea, no fever/chills/night sweats.  No acute events overnight per nursing staff.  Objective: Vitals:   09/06/18 1725 09/06/18 2103 09/07/18 0550 09/07/18 0820  BP: 113/62 126/70 128/76  132/65  Pulse: 88 93 87 89  Resp:  Temp: 98.1 F (36.7 C) 98.1 F (36.7 C) 98 F (36.7 C) 98 F (36.7 C)  TempSrc: Oral Oral Oral Oral  SpO2: 97% 100% 100% 100%    Intake/Output Summary (Last 24 hours) at 09/07/2018 1215 Last data filed at 09/07/2018 0820 Gross per 24 hour  Intake 240 ml  Output 450 ml  Net -210 ml   There were no vitals filed for this visit.  Examination:  General exam: Appears calm and comfortable, pleasantly confused Respiratory system: Clear to auscultation. Respiratory effort normal. Cardiovascular system: S1 & S2 heard, RRR. No JVD, murmurs, rubs, gallops or clicks. No pedal edema. Gastrointestinal system: Abdomen is nondistended, soft and nontender. No organomegaly or masses felt. Normal bowel sounds heard. Central nervous system: Alert and oriented. No focal neurological deficits. Extremities: Symmetric 5 x 5 power. Skin: No rashes, lesions or ulcers Psychiatry: Alert to place ([facility]ospital[facility]), not to person/time    Data Reviewed: I have personally reviewed following labs and imaging studies  CBC: Recent Labs  Lab 09/05/18 1204 09/06/18 0556 09/07/18 0237  WBC 12.9* 8.1 8.5  NEUTROABS 9.4*  --   --   HGB 9.3* 7.9* 8.2*  HCT 33.3* 28.8* 29.8*  MCV 84.9 86.2 85.4  PLT 251 214 219   Basic Metabolic Panel: Recent Labs  Lab 09/05/18 1204 09/06/18 0556 09/07/18 0237  NA 136 139 138  K 4.1 3.7 3.6  CL 105 105 108  CO2 GLUCOSE 117* 87 95  BUN 29* 22 18  CREATININE 1.33* 0.89 0.94  CALCIUM 8.9 7.8* 7.8*  MG 2.1  --   --    GFR: CrCl cannot be calculated (Unknown ideal weight.). Liver Function Tests: Recent Labs  Lab 09/05/18 1204  AST 19  ALT 13  ALKPHOS 102  BILITOT 1.9*  PROT 6.8  ALBUMIN 2.5*   Recent Labs  Lab 09/05/18 1204  LIPASE 21   Recent Labs  Lab 09/05/18 1204  AMMONIA 11   Coagulation Profile: Recent Labs  Lab 09/05/18 1204  INR 1.1   Cardiac Enzymes: Recent Labs  Lab 09/05/18  1204  CKTOTAL 116  TROPONINI <0.03   BNP (last 3 results) No results for input(s): PROBNP in the last 8760 hours. HbA1C: No results for input(s): HGBA1C in the last 72 hours. CBG: Recent Labs  Lab 09/05/18 1153  GLUCAP 106*   Lipid Profile: No results for input(s): CHOL, HDL, LDLCALC, TRIG, CHOLHDL, LDLDIRECT in the last 72 hours. Thyroid Function Tests: Recent Labs    09/05/18 1204  TSH 3.410   Anemia Panel: Recent Labs    09/05/18 1204  VITAMINB12 488  FOLATE 12.1  FERRITIN 21*  TIBC 266  IRON 16*  RETICCTPCT 1.7   Sepsis Labs: No results for input(s): PROCALCITON, LATICACIDVEN in the last 168 hours.  Recent Results (from the past 240 hour(s))  SARS Coronavirus 2 (CEPHEID - Performed in [facility] hospital lab), Hosp Order     Status: None   Collection Time: 09/05/18  1:10 PM  Result Value Ref Range Status   SARS Coronavirus 2 NEGATIVE NEGATIVE Final    Comment: (NOTE) If result is NEGATIVE SARS-CoV-2 target nucleic acids are NOT DETECTED. The SARS-CoV-2  RNA is generally detectable in upper and lower  respiratory specimens during the acute phase of infection. The lowest  concentration of SARS-CoV-2 viral copies this assay can detect is 250  copies / mL. A negative result does not preclude SARS-CoV-2 infection  and should not be used as the sole basis for treatment or other  patient management decisions.  A negative result may occur with  improper specimen collection / handling, submission of specimen other  than nasopharyngeal swab, presence of viral mutation(s) within the  areas targeted by this assay, and inadequate number of viral copies  (<250 copies / mL). A negative result must be combined with clinical  observations, patient history, and epidemiological information. If result is POSITIVE SARS-CoV-2 target nucleic acids are DETECTED. The SARS-CoV-2 RNA is generally detectable in upper and lower  respiratory specimens dur ing the acute phase of  infection.  Positive  results are indicative of active infection with SARS-CoV-2.  Clinical  correlation with patient history and other diagnostic information is  necessary to determine patient infection status.  Positive results do  not rule out bacterial infection or co-infection with other viruses. If result is PRESUMPTIVE POSTIVE SARS-CoV-2 nucleic acids MAY BE PRESENT.   A presumptive positive result was obtained on the submitted specimen  and confirmed on repeat testing.  While 2019 novel coronavirus  (SARS-CoV-2) nucleic acids may be present in the submitted sample  additional confirmatory testing may be necessary for epidemiological  and / or clinical management purposes  to differentiate between  SARS-CoV-2 and other Sarbecovirus currently known to infect humans.  If clinically indicated additional testing with an alternate test  methodology [id number]) is advised. The SARS-CoV-2 RNA is generally  detectable in upper and lower respiratory sp ecimens during the acute  phase of infection. The expected result is Negative. Fact Sheet for Patients:  BoilerBrush.com.cy Fact Sheet for Healthcare Providers: https://pope.com/ This test is not yet approved or cleared by the [place] FDA and has been authorized for detection and/or diagnosis of SARS-CoV-2 by FDA under an Emergency Use Authorization (EUA).  This EUA will remain in effect (meaning this test can be used) for the duration of the COVID-19 declaration under Section 564(b)(1) of the Act, 21 U.S.C. section 360bbb-3(b)(1), unless the authorization is terminated or revoked sooner. Performed at [facility], [address], [place], [place] [address]   Urine culture     Status: Abnormal   Collection Time: 09/05/18  2:40 PM  Result Value Ref Range Status   Specimen Description URINE, CATHETERIZED  Final   Special Requests   Final    NONE Performed at [facility], [address] [place], [place] [address]    Culture >=100,000 COLONIES/mL KLEBSIELLA PNEUMONIAE (A)  Final   Report Status 09/07/2018 FINAL  Final   Organism ID, Bacteria KLEBSIELLA PNEUMONIAE (A)  Final      Susceptibility   Klebsiella pneumoniae - MIC*    AMPICILLIN >=32 RESISTANT Resistant     CEFAZOLIN <=4 SENSITIVE Sensitive     CEFTRIAXONE <=1 SENSITIVE Sensitive     CIPROFLOXACIN <=0.25 SENSITIVE Sensitive     GENTAMICIN <=1 SENSITIVE Sensitive     IMIPENEM <=0.25 SENSITIVE Sensitive     NITROFURANTOIN 128 RESISTANT Resistant     TRIMETH/SULFA <=20 SENSITIVE Sensitive     AMPICILLIN/SULBACTAM 4 SENSITIVE Sensitive     PIP/TAZO <=4 SENSITIVE Sensitive     Extended ESBL NEGATIVE Sensitive     * >=100,000 COLONIES/mL KLEBSIELLA PNEUMONIAE  Radiology Studies: Dg Chest 2 View  Result Date: 09/05/2018 CLINICAL DATA:  Fall and altered mental status. EXAM: CHEST - 2 VIEW COMPARISON:  None FINDINGS: Two views of the chest demonstrate low lung volumes. Coarse lung markings may be chronic. Heart size is upper limits of normal and likely accentuated by low inspiratory effort. However, there may be an enlarged ascending thoracic aorta. Trachea is midline. Negative for a pneumothorax. Bridging osteophytes in the mid and lower thoracic spine region. No large pleural effusions. IMPRESSION: No active cardiopulmonary disease. Concern for enlarged ascending thoracic aorta. Electronically Signed   By: Richarda Overlie M.D.   On: 09/05/2018 13:37   Ct Head Wo Contrast  Result Date: 09/05/2018 CLINICAL DATA:  85 year old with multiple falls. EXAM: CT HEAD WITHOUT CONTRAST CT CERVICAL SPINE WITHOUT CONTRAST TECHNIQUE: Multidetector CT imaging of the head and cervical spine was performed following the standard protocol without intravenous contrast. Multiplanar CT image reconstructions of the cervical spine were also generated. COMPARISON:  08/28/2012 FINDINGS: CT HEAD FINDINGS Brain: Diffuse  cerebral atrophy. Extensive low density throughout the white matter and similar to the prior examination. No evidence for acute hemorrhage, mass lesion, midline shift, hydrocephalus or large infarct. Vascular: No hyperdense vessel or unexpected calcification. Skull: Normal. Negative for fracture or focal lesion. Sinuses/Orbits: Small amount of mucosal disease in the sphenoid sinuses. Other: None. CT CERVICAL SPINE FINDINGS Alignment: Normal. Skull base and vertebrae: No acute fracture. No primary bone lesion or focal pathologic process. Soft tissues and spinal canal: No prevertebral fluid or swelling. No visible canal hematoma. Disc levels: Mild disc space narrowing throughout the cervical spine, most prominent at C3-C4. Facet arthropathy along the right side C2-C3. Upper chest: Negative. Other: None IMPRESSION: 1. No acute intracranial abnormality. 2. No acute bone abnormality in cervical spine. 3. Diffuse cerebral atrophy. Chronic white matter disease likely represents chronic small vessel ischemic changes. Electronically Signed   By: Richarda Overlie M.D.   On: 09/05/2018 14:22   Ct Cervical Spine Wo Contrast  Result Date: 09/05/2018 CLINICAL DATA:  85 year old with multiple falls. EXAM: CT HEAD WITHOUT CONTRAST CT CERVICAL SPINE WITHOUT CONTRAST TECHNIQUE: Multidetector CT imaging of the head and cervical spine was performed following the standard protocol without intravenous contrast. Multiplanar CT image reconstructions of the cervical spine were also generated. COMPARISON:  08/28/2012 FINDINGS: CT HEAD FINDINGS Brain: Diffuse cerebral atrophy. Extensive low density throughout the white matter and similar to the prior examination. No evidence for acute hemorrhage, mass lesion, midline shift, hydrocephalus or large infarct. Vascular: No hyperdense vessel or unexpected calcification. Skull: Normal. Negative for fracture or focal lesion. Sinuses/Orbits: Small amount of mucosal disease in the sphenoid sinuses.  Other: None. CT CERVICAL SPINE FINDINGS Alignment: Normal. Skull base and vertebrae: No acute fracture. No primary bone lesion or focal pathologic process. Soft tissues and spinal canal: No prevertebral fluid or swelling. No visible canal hematoma. Disc levels: Mild disc space narrowing throughout the cervical spine, most prominent at C3-C4. Facet arthropathy along the right side C2-C3. Upper chest: Negative. Other: None IMPRESSION: 1. No acute intracranial abnormality. 2. No acute bone abnormality in cervical spine. 3. Diffuse cerebral atrophy. Chronic white matter disease likely represents chronic small vessel ischemic changes. Electronically Signed   By: Richarda Overlie M.D.   On: 09/05/2018 14:22        Scheduled Meds: . ciprofloxacin  500 mg Oral BID  . enoxaparin (LOVENOX) injection  40 mg Subcutaneous Q24H   Continuous Infusions:    LOS: 2 days  Time spent: 31 minutes    Alvira PhilipsEric J [place], DO [facility] Pager [phone]  If 7PM-7AM, please contact night-coverage [email or web address] Password [email or web address] 09/07/2018, 12:15 PM

## 2018-09-08 ENCOUNTER — Encounter (HOSPITAL_COMMUNITY): Payer: Self-pay | Admitting: *Deleted

## 2018-09-08 ENCOUNTER — Other Ambulatory Visit: Payer: Self-pay

## 2018-09-08 LAB — BASIC METABOLIC PANEL
Anion gap: 10 (ref 5–15)
BUN: 15 mg/dL (ref 8–23)
CO2: 24 mmol/L (ref 22–32)
Calcium: 8.1 mg/dL — ABNORMAL LOW (ref 8.9–10.3)
Chloride: 103 mmol/L (ref 98–111)
Creatinine, Ser: 0.85 mg/dL (ref 0.61–1.24)
GFR calc Af Amer: >60 mL/min (ref >60)
GFR calc non Af Amer: >60 mL/min (ref >60)
Glucose, Bld: 85 mg/dL (ref 70–99)
Potassium: 3.9 mmol/L (ref 3.5–5.1)
Sodium: 137 mmol/L (ref 135–145)

## 2018-09-08 LAB — MAGNESIUM: Magnesium: 2.1 mg/dL (ref 1.7–2.4)

## 2018-09-08 NOTE — Progress Notes (Signed)
PROGRESS NOTE    Arthur Sims  KAJ:[id number] DOB: 08-Nov-1924 DOA: 09/05/2018 PCP: Tomi Bamberger, NP (Inactive)    Brief Narrative:   Arthur Sims is a 85 y.o. male with no significant past medical history, patient has not been to a doctor in over 10 years, does not take any medications on a regular basis.  He lives by himself but has a daughter that checks on him frequently she reports mild memory and cognitive decline for months to a year. Over the last 3 to 4 days patient has been more confused than usual, daughter reports multiple falls as well over the last 3 days, on Thursday EMS was called following a fall but patient refused to come to the ED for further evaluation subsequently he fell couple more times he denies any pain anywhere, has been more agitated and confused over the last couple of days which is why his daughter had him brought to the emergency room today  ED Course: Vital signs stable, labs notable for creatinine of 1.3, white count of 12.9, hemoglobin of 9.3, abnormal UA with positive nitrite, leuk esterase, many bacteria and 6-10 WBCs, CT head noted diffuse atrophy, no acute abnormalities, SARS COVID-19 PCR was negative. Patient was agitated in the emergency room was given Ativan IV x1 however continues to be angry agitated and confused   Assessment & Plan:   Active Problems:   Acute metabolic encephalopathy   Sepsis secondary to UTI (HCC)   Sepsis, present on admission Klebsiella pneumonia urinary tract infection Patient presenting with worsening confusion over the last 3-4 days.  Noted to have elevated WBC count of 12.9, temperature of 97.4.  Urinalysis notable for small leukoesterase with positive nitrite and 6-10 WBCs.  Urine culture with greater than 100 K GNR's.  COVID-19 negative. Chest x-ray with no acute cardiopulmonary disease process. --De-escalate from ceftriaxone to Kelflex 500mg  PO BID, plan complete 5-day course based on urine culture identification and  susceptibilities. --Supportive care  Acute metabolic encephalopathy Patient presenting with confusion, no significant past medical history and not on any medications.  Multiple falls over the last few days per family.  Increased agitation.  Noted to have UTI as above.  CT head/neck with diffuse atrophy otherwise unrevealing.  Troponins less than 0.03.  TSH within normal limits, ammonia level normal.  Daughter reports that his confusion has been progressing over the past few years, but more acutely over the last few days likely from infection as above.  Patient also likely has underlying dementia. 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 --Case management for coordination of placement  Acute renal failure: resolved Unknown baseline, has not been to a physician in over 10 years.  Creatinine on admission 1.33 --Cr 1.33-->0.89-->0.85 --Continue gentle IV fluid hydration with NS at 75 mL's per hour --Repeat BMP in a.m.   DVT prophylaxis: Lovenox Code Status: DNR Family Communication: Discussed with patient's daughter Gavin Pound over the telephone today Disposition Plan: PT/OT recommends SNF, case management for coordination   Consultants:   none  Procedures:   none  Antimicrobials:  Ceftriaxone 5/16 - 5/17  Ciprofloxacin 5/18>>>   Subjective: Patient seen and examined at bedside, pleasantly confused.  Appears to be in good spirits this morning.  No specific complaints.  Denies headache, no chest pain, no shortness of breath, no  palpitations, no abdominal pain, no nausea/vomiting/diarrhea, no fever/chills/night sweats.  No acute events overnight per nursing staff.  Objective: Vitals:   09/07/18 1720 09/07/18 2155 09/08/18 0526 09/08/18 0929  BP: 123/71 121/71 113/77  116/66  Pulse: 90 81 81 83  Resp: 18 18 17  (!) 22  Temp: 98 F (36.7 C) 97.8 F (36.6 C) (!) 97.4 F (36.3 C) (!) 97.5 F (36.4 C)  TempSrc: Oral Oral Oral Oral  SpO2: 100% 99% 97% 97%    Intake/Output Summary (Last 24 hours) at 09/08/2018 1051 Last data filed at 09/07/2018 2200 Gross per 24 hour  Intake 437 ml  Output 350 ml  Net 87 ml   There were no vitals filed for this visit.  Examination:  General exam: Appears calm and comfortable, pleasantly confused Respiratory system: Clear to auscultation. Respiratory effort normal. Cardiovascular system: S1 & S2 heard, RRR. No JVD, murmurs, rubs, gallops or clicks. No pedal edema. Gastrointestinal system: Abdomen is nondistended, soft and nontender. No organomegaly or masses felt. Normal bowel sounds heard. Central nervous system: Alert and oriented. No focal neurological deficits. Extremities: Symmetric 5 x 5 power. Skin: No rashes, lesions or ulcers Psychiatry: Alert to place ([facility]), not to person/time    Data Reviewed: I have personally reviewed following labs and imaging studies  CBC: Recent Labs  Lab 09/05/18 1204 09/06/18 0556 09/07/18 0237  WBC 12.9* 8.1 8.5  NEUTROABS 9.4*  --   --   HGB 9.3* 7.9* 8.2*  HCT 33.3* 28.8* 29.8*  MCV 84.9 86.2 85.4  PLT 251 214 219   Basic Metabolic Panel: Recent Labs  Lab 09/05/18 1204 09/06/18 0556 09/07/18 0237 09/08/18 0438  NA 136 139 138 137  K 4.1 3.7 3.6 3.9  CL 105 105 108 103  CO2 23 23 23 24   GLUCOSE 117* 87 95 85  BUN 29* 22 18 15   CREATININE 1.33* 0.89 0.94 0.85  CALCIUM 8.9 7.8* 7.8* 8.1*  MG 2.1  --   --  2.1   GFR: CrCl cannot be calculated (Unknown ideal weight.). Liver Function Tests: Recent Labs  Lab 09/05/18 1204  AST 19  ALT 13  ALKPHOS 102  BILITOT 1.9*  PROT 6.8  ALBUMIN 2.5*   Recent Labs  Lab 09/05/18 1204  LIPASE 21   Recent Labs  Lab 09/05/18 1204  AMMONIA 11   Coagulation Profile: Recent Labs  Lab 09/05/18  1204  INR 1.1   Cardiac Enzymes: Recent Labs  Lab 09/05/18 1204  CKTOTAL 116  TROPONINI <0.03   BNP (last 3 results) No results for input(s): PROBNP in the last 8760 hours. HbA1C: No results for input(s): HGBA1C in the last 72 hours. CBG: Recent Labs  Lab 09/05/18 1153  GLUCAP 106*   Lipid Profile: No results for input(s): CHOL, HDL, LDLCALC, TRIG, CHOLHDL, LDLDIRECT in the last 72 hours. Thyroid Function Tests: Recent Labs    09/05/18 1204  TSH 3.410   Anemia Panel: Recent Labs    09/05/18 1204  VITAMINB12 488  FOLATE 12.1  FERRITIN 21*  TIBC 266  IRON 16*  RETICCTPCT 1.7   Sepsis Labs: No results for input(s): PROCALCITON, LATICACIDVEN in the last 168 hours.  Recent Results (from the past 240 hour(s))  SARS Coronavirus 2 (CEPHEID - Performed [facility]), Hosp Order     Status: None   Collection Time: 09/05/18  1:10 PM  Result Value Ref Range Status   SARS Coronavirus 2 NEGATIVE NEGATIVE Final    Comment: (NOTE)  If result is NEGATIVE SARS-CoV-2 target nucleic acids are NOT DETECTED. The SARS-CoV-2 RNA is generally detectable in upper and lower  respiratory specimens during the acute phase of infection. The lowest  concentration of SARS-CoV-2 viral copies this assay can detect is 250  copies / mL. A negative result does not preclude SARS-CoV-2 infection  and should not be used as the sole basis for treatment or other  patient management decisions.  A negative result may occur with  improper specimen collection / handling, submission of specimen other  than nasopharyngeal swab, presence of viral mutation(s) within the  areas targeted by this assay, and inadequate number of viral copies  (<250 copies / mL). A negative result must be combined with clinical  observations, patient history, and epidemiological information. If result is POSITIVE SARS-CoV-2 target nucleic acids are DETECTED. The SARS-CoV-2 RNA is generally detectable in upper  and lower  respiratory specimens dur ing the acute phase of infection.  Positive  results are indicative of active infection with SARS-CoV-2.  Clinical  correlation with patient history and other diagnostic information is  necessary to determine patient infection status.  Positive results do  not rule out bacterial infection or co-infection with other viruses. If result is PRESUMPTIVE POSTIVE SARS-CoV-2 nucleic acids MAY BE PRESENT.   A presumptive positive result was obtained on the submitted specimen  and confirmed on repeat testing.  While 2019 novel coronavirus  (SARS-CoV-2) nucleic acids may be present in the submitted sample  additional confirmatory testing may be necessary for epidemiological  and / or clinical management purposes  to differentiate between  SARS-CoV-2 and other Sarbecovirus currently known to infect humans.  If clinically indicated additional testing with an alternate test  methodology [phone]) is advised. The SARS-CoV-2 RNA is generally  detectable in upper and lower respiratory sp ecimens during the acute  phase of infection. The expected result is Negative. Fact Sheet for Patients:  BoilerBrush.com.cy Fact Sheet for Healthcare Providers: https://pope.com/ This test is not yet approved or cleared by the [place] FDA and has been authorized for detection and/or diagnosis of SARS-CoV-2 by FDA under an Emergency Use Authorization (EUA).  This EUA will remain in effect (meaning this test can be used) for the duration of the COVID-19 declaration under Section 564(b)(1) of the Act, 21 U.S.C. section 360bbb-3(b)(1), unless the authorization is terminated or revoked sooner. Performed at [facility], [address], [place], [place] [address]   Urine culture     Status: Abnormal   Collection Time: 09/05/18  2:40 PM  Result Value Ref Range Status   Specimen Description URINE, CATHETERIZED  Final    Special Requests   Final    NONE Performed at [facility], [address] [place], [place] [address]    Culture >=100,000 COLONIES/mL KLEBSIELLA PNEUMONIAE (A)  Final   Report Status 09/07/2018 FINAL  Final   Organism ID, Bacteria KLEBSIELLA PNEUMONIAE (A)  Final      Susceptibility   Klebsiella pneumoniae - MIC*    AMPICILLIN >=32 RESISTANT Resistant     CEFAZOLIN <=4 SENSITIVE Sensitive     CEFTRIAXONE <=1 SENSITIVE Sensitive     CIPROFLOXACIN <=0.25 SENSITIVE Sensitive     GENTAMICIN <=1 SENSITIVE Sensitive     IMIPENEM <=0.25 SENSITIVE Sensitive     NITROFURANTOIN 128 RESISTANT Resistant     TRIMETH/SULFA <=20 SENSITIVE Sensitive     AMPICILLIN/SULBACTAM 4 SENSITIVE Sensitive     PIP/TAZO <=4 SENSITIVE Sensitive  Extended ESBL NEGATIVE Sensitive     * >=100,000 COLONIES/mL KLEBSIELLA PNEUMONIAE         Radiology Studies: No results found.      Scheduled Meds: . cephALEXin  500 mg Oral Q12H  . enoxaparin (LOVENOX) injection  40 mg Subcutaneous Q24H   Continuous Infusions:    LOS: 3 days    Time spent: 28 minutes    Alvira Philips [place], DO [facility] Pager [phone]  If 7PM-7AM, please contact night-coverage [email or web address] Password [facility] 09/08/2018, 10:51 AM

## 2018-09-08 NOTE — Care Management Important Message (Signed)
Important Message  Patient Details  Name: Arthur Sims MRN: [id number] Date of Birth: 1924-05-31   Medicare Important Message Given:  Yes    Kam Kushnir Stefan Church 09/08/2018, 2:59 PM

## 2018-09-08 NOTE — Progress Notes (Signed)
Physical Therapy Treatment Patient Details Name: Arthur GuernseyJames G Sims MRN: [id number] DOB: [birth year] Today's Date: 09/08/2018    History of Present Illness Pt is a 85 y.o. male with no significant past medical history, patient has not been to a doctor in over 10 years, does not take any medications on a regular basis. Presents with increased confusion and multiple falls over the last few days. Found with UTI.     PT Comments    Continuing work on functional mobility and activity tolerance;  Notable progress with gait distance and activity tolerance; He seemed pleased to walk the hallway; TEnds to lose balance with turns and needs assist to steady and prevent fall; Overall progressing well; Anticipate continuing good progress at post-acute rehabilitation.    Follow Up Recommendations  SNF     Equipment Recommendations  Rolling walker with 5" wheels;3in1 (PT)    Recommendations for Other Services       Precautions / Restrictions Precautions Precautions: Fall    Mobility  Bed Mobility Overal bed mobility: Needs Assistance Bed Mobility: Supine to Sit     Supine to sit: Min assist     General bed mobility comments: Min handheld assist to pull to sit  Transfers Overall transfer level: Needs assistance Equipment used: 2 person hand held assist Transfers: Sit to/from Stand Sit to Stand: Mod assist         General transfer comment: light mod assist to power up  Ambulation/Gait Ambulation/Gait assistance: Min assist Gait Distance (Feet): (Hallway ambulation) Assistive device: 2 person hand held assist Gait Pattern/deviations: Step-through pattern;Decreased step length - right;Decreased step length - left;Decreased stride length     General Gait Details: Notable losses of balance with turns, requiring min assist to steady; erratic step width; seemed pleased to be able to walk   Stairs             Wheelchair Mobility    Modified Rankin (Stroke Patients Only)        Balance Overall balance assessment: Needs assistance;History of Falls                                          Cognition Arousal/Alertness: Awake/alert Behavior During Therapy: WFL for tasks assessed/performed Overall Cognitive Status: Impaired/Different from baseline Area of Impairment: Orientation;Attention;Memory;Following commands;Safety/judgement;Awareness;Problem solving                 Orientation Level: Disoriented to;Place;Time;Situation Current Attention Level: Sustained Memory: Decreased recall of precautions;Decreased short-term memory                Exercises      General Comments        Pertinent Vitals/Pain Pain Assessment: No/denies pain    Home Living                      Prior Function            PT Goals (current goals can now be found in the care plan section) Acute Rehab PT Goals Patient Stated Goal: wants to go home PT Goal Formulation: With patient Potential to Achieve Goals: Good Progress towards PT goals: Progressing toward goals    Frequency    Min 2X/week      PT Plan Current plan remains appropriate    Co-evaluation              AM-PAC PT "6 Clicks" Mobility  Outcome Measure  Help needed turning from your back to your side while in a flat bed without using bedrails?: A Little Help needed moving from lying on your back to sitting on the side of a flat bed without using bedrails?: A Little Help needed moving to and from a bed to a chair (including a wheelchair)?: A Little Help needed standing up from a chair using your arms (e.g., wheelchair or bedside chair)?: A Little Help needed to walk in hospital room?: A Little Help needed climbing 3-5 steps with a railing? : A Lot 6 Click Score: 17    End of Session Equipment Utilized During Treatment: Gait belt Activity Tolerance: Patient tolerated treatment well Patient left: in chair;with call bell/phone within reach;with chair  alarm set Nurse Communication: Mobility status PT Visit Diagnosis: Unsteadiness on feet (R26.81)     Time: 9458-5929 PT Time Calculation (min) (ACUTE ONLY): 22 min  Charges:  $Gait Training: 8-22 mins                     Van Clines, PT  Acute Rehabilitation Services Pager [phone] Office [phone]    Arthur Sims 09/08/2018, 3:11 PM

## 2018-09-08 NOTE — NC FL2 (Addendum)
[place] MEDICAID FL2 LEVEL OF CARE SCREENING TOOL     IDENTIFICATION  Patient Name: Arthur Sims Birthdate: February 09, 1925 Sex: male Admission Date (Current Location): 09/05/2018  [facility] [place]   [facility] and Address[facility]  [facility], [address], [place], [place] [address]      Provider Number: [id number]  Attending Physician Name and Address:  [place], Eric J, DO  Relative Name and Phone Number:  Edythe LynnDeborah Cassetta - daughter, [phone] (home), [phone] (cell)    Current Level of Care: Hospital Recommended Level of Care: Skilled Nursing Facility Prior Approval Number:    Date Approved/Denied:   PASRR Number: [id number](MUST ID #[id number])  Discharge Plan: SNF    Current Diagnoses: Patient Active Problem List   Diagnosis Date Noted  . Acute metabolic encephalopathy 09/05/2018  . Sepsis secondary to UTI (HCC) 09/05/2018    Orientation RESPIRATION BLADDER Height & Weight     Self, Place  Normal Incontinent Weight:   Height:     BEHAVIORAL SYMPTOMS/MOOD NEUROLOGICAL BOWEL NUTRITION STATUS   Affect and Attidude appropriate    Continent Diet(Regular)  AMBULATORY STATUS COMMUNICATION OF NEEDS Skin   Total Care(Patient was unable to ambulate with PT) Verbally Skin abrasions(Skin tear left arm with foam dressing)                       Personal Care Assistance Level of Assistance  Bathing, Feeding, Dressing Bathing Assistance: Maximum assistance Feeding assistance: Limited assistance(Min assist per OT) Dressing Assistance: Maximum assistance     Functional Limitations Info  Sight, Hearing, Speech Sight Info: Adequate Hearing Info: Impaired Speech Info: Adequate    SPECIAL CARE FACTORS FREQUENCY  PT (By licensed PT), OT (By licensed OT)     PT Frequency: Evaluation in hospital 5/17. PT at SNF Eval and Treat; Minimum of 5 days per week OT Frequency: Evaluation in hospital 5/17. OT at SNF Eval and Treat;  Minimum of 5 days per week             Contractures Contractures Info: Not present    Additional Factors Info  Code Status, Allergies Code Status Info: DNR Allergies Info: Ceftriaxone           Current Medications (09/08/2018):  This is the current hospital active medication list Current Facility-Administered Medications  Medication Dose Route Frequency Provider Last Rate Last Dose  . acetaminophen (TYLENOL) tablet 650 mg  650 mg Oral Q6H PRN Zannie CoveJoseph, Preetha, MD   650 mg at 09/07/18 2238   Or  . acetaminophen (TYLENOL) suppository 650 mg  650 mg Rectal Q6H PRN Zannie CoveJoseph, Preetha, MD      . cephALEXin [facility](KEFLEX) capsule 500 mg  500 mg Oral Q12H [place], Alvira Philipsric J, DO   500 mg at 09/08/18 0841  . enoxaparin (LOVENOX) injection 40 mg  40 mg Subcutaneous Q24H Zannie CoveJoseph, Preetha, MD   40 mg at 09/07/18 1614  . haloperidol lactate (HALDOL) injection 2 mg  2 mg Intravenous Q6H PRN Zannie CoveJoseph, Preetha, MD   2 mg at 09/07/18 2238  . ondansetron (ZOFRAN) tablet 4 mg  4 mg Oral Q6H PRN Zannie CoveJoseph, Preetha, MD       Or  . ondansetron [facility] [place] [facility](ZOFRAN) injection 4 mg  4 mg Intravenous Q6H PRN Zannie CoveJoseph, Preetha, MD   4 mg at 09/08/18 [id number]     Discharge Medications: Please see discharge summary for a list of discharge medications.  Relevant Imaging Results:  Relevant Lab Results:  Additional Information ss#[id number].  COVID-19 test negative.   Cristobal Goldmann, LCSW

## 2018-09-08 NOTE — TOC Initial Note (Signed)
Transition of Care [facility]) - Initial/Assessment Note    Patient Details  Name: Arthur Sims MRN: [id number] Date of Birth: 01/21/1925  Transition of Care [facility]) CM/SW Contact:    Cristobal Goldmann, LCSW Phone Number: 09/08/2018, 11:36 AM  Clinical Narrative:  CSW talked with daughter by phone on 09/07/18 regarding discharge disposition for patient and recommendation of ST rehab. [name] reported that her dad has never been in a hospital before and on surgeries other than oral surgery. She added that patient has a PCP - Dr. Valentina Lucks from years ago, but was not compliant. She would make the appointment and when the doctor's office called to remind him of his appointment, he would cancel it. Daughter reported that about 3 years ago she found "[facility]" and called them. Their last visit to her dad was a couple of weeks ago and the doctor came to see him and later someone from a lab company came to draw labs. [name] added that years ago her dad was going to the dentist, then stopped going.  Daughter indicated that her dad "calls the shots" and she can only do what he will allow her to do  Ms. Cassetta reported that last Friday she found her dad down when she went to visit. She went to a neighbors house to get help and when they returned to his house, her dad had gotten up. She added that on Saturday morning she again found him on the floor and he was delirious. He also talked to her husband in the 3rd person.  Talked with daughter regarding SNF recommendation for ST rehab and daughter in agreement. CSW explained the facility search process and provided Medicare.gov web site information and how to find SNF information  Also informed daughter of what patient will need in terms of clothing, etc. at the skilled facility and that family's cannot go into the facility, but can possibly visit with their family member through the window of his room, and Ms. Cassetta expressed  understanding.                 .   Expected Discharge Plan: Skilled Nursing Facility Barriers to Discharge: Continued Medical Work up   Patient Goals and CMS Choice Patient states their goals for this hospitalization and ongoing recovery are:: Peer daughter, her dad needs to get stronger so that he will be safe at home CMS Medicare.gov Compare Post Acute Care list provided to:: Other (Comment Required)(Daughter advised of website) Choice offered to / list presented to : Adult Children(Daughter provided with website information)  Expected Discharge Plan and Services Expected Discharge Plan: Skilled Nursing Facility In-house Referral: Clinical Social Work   Post Acute Care Choice: Skilled Nursing Facility Living arrangements for the past 2 months: Single Family Home                                      Prior Living Arrangements/Services Living arrangements for the past 2 months: Single Family Home Lives with:: Self Patient language and need for interpreter reviewed:: No Do you feel safe going back to the place where you live?: No   Per daughter her dad is not safe at this time to discharge home  Need for Family Participation in Patient Care: Yes (Comment) Care giver support system in place?: Yes (comment)(Daughter checks in on patient frequently)   Criminal Activity/Legal Involvement Pertinent to Current Situation/Hospitalization:  No - Comment as needed  Activities of Daily Living Home Assistive Devices/Equipment: None ADL Screening (condition at time of admission) Patient's cognitive ability adequate to safely complete daily activities?: Yes Is the patient deaf or have difficulty hearing?: Yes Does the patient have difficulty seeing, even when wearing glasses/contacts?: No Does the patient have difficulty concentrating, remembering, or making decisions?: Yes Patient able to express need for assistance with ADLs?: Yes Does the patient have difficulty dressing or  bathing?: No Independently performs ADLs?: Yes (appropriate for developmental age) Does the patient have difficulty walking or climbing stairs?: Yes Weakness of Legs: Both Weakness of Arms/Hands: None  Permission Sought/Granted Permission sought to share information with : Other (comment)(No, patient oriented to self only) Permission granted to share information with : No(Patient oriented to self only)              Emotional Assessment Appearance:: Appears stated age     Orientation: : Oriented to Self Alcohol / Substance Use: Tobacco Use, Alcohol Use, Illicit Drugs(No history of smoking, drinking or using illicit drugs.) Psych Involvement: No (comment)  Admission diagnosis:  Confusion [R41.0] Acute cystitis with hematuria [N30.01] Acute encephalopathy [G93.40] Multiple falls [R29.6] Patient Active Problem List   Diagnosis Date Noted  . Acute metabolic encephalopathy 09/05/2018  . Sepsis secondary to UTI (HCC) 09/05/2018   PCP:  Arthur Sims, Susan, NP (Inactive) Pharmacy:   [facility] [phone] [place] [address] [place] [id number] Phone: [phone] Fax: [phone]     Social Determinants of Health (SDOH) Interventions  No SDOH Interventions needed at this time.  Readmission Risk Interventions No flowsheet data found.

## 2018-09-09 NOTE — TOC Progression Note (Addendum)
Transition of Care [facility]) - Progression Note    Patient Details  Name: Arthur Sims MRN: [id number] Date of Birth: Mar 01, 1925  Transition of Care [facility]) CM/SW Contact  Doy Hutching, [place] Phone Number: 09/09/2018, 3:42 PM  Clinical Narrative:    4:09pm- Pt accepted at [facility]. Handoff updated for CSW returning tomorrow.   3:42pm- Spoke with pt preferred SNF [facility], admissions liaison Patton Salles states they need a fax with pt negative screen and 3 days of vitals before offering on pt. Fax # [phone].   Expected Discharge Plan: Skilled Nursing Facility Barriers to Discharge: Continued Medical Work up  Expected Discharge Plan and Services Expected Discharge Plan: Skilled Nursing Facility In-house Referral: Clinical Social Work   Post Acute Care Choice: Skilled Nursing Facility Living arrangements for the past 2 months: Single Family Home                                       Social Determinants of Health (SDOH) Interventions    Readmission Risk Interventions No flowsheet data found.

## 2018-09-09 NOTE — Progress Notes (Signed)
Occupational Therapy Treatment Patient Details Name: Arthur Sims MRN: [id number] DOB: 14-Feb-1925 Today's Date: 09/09/2018    History of present illness Pt is a 85 y.o. male with no significant past medical history, patient has not been to a doctor in over 10 years, does not take any medications on a regular basis. Presents with increased confusion and multiple falls over the last few days. Found with UTI.    OT comments  Pt slowly progressing toward OT goals. Pt received in bed ready to "stand and move". Pt tolerated session well with no complaint of pain, however, reports stiffness. Pt engaged in AROM at EOB to prepare for functional mobility to recliner for simulated toilet transfer. Mod A for functional transfer and simulated toilet transfer for safety awareness. Pt required several VC to ensure pt was "close to walker". DC and freq remains the same. OT will continue to follow acutely.    Follow Up Recommendations  SNF;Supervision/Assistance - 24 hour    Equipment Recommendations  Other (comment)(TBD at next venue of care)    Recommendations for Other Services      Precautions / Restrictions Precautions Precautions: Fall Restrictions Weight Bearing Restrictions: No       Mobility Bed Mobility Overal bed mobility: Needs Assistance Bed Mobility: Supine to Sit     Supine to sit: Min assist Sit to supine: Min assist   General bed mobility comments: Min handheld assist to pull to sit  Transfers Overall transfer level: Needs assistance Equipment used: Rolling walker (2 wheeled) Transfers: Sit to/from Stand Sit to Stand: Mod assist         General transfer comment: light mod assist to power up, cues for functional mobility with use of RW.     Balance Overall balance assessment: Needs assistance;History of Falls Sitting-balance support: No upper extremity supported;Feet supported Sitting balance-Leahy Scale: Fair     Standing balance support: Bilateral upper  extremity supported;During functional activity Standing balance-Leahy Scale: Poor Standing balance comment: posterior lean                           ADL either performed or assessed with clinical judgement   ADL Overall ADL's : Needs assistance/impaired                 Upper Body Dressing : Moderate assistance   Lower Body Dressing: Total assistance;+2 for physical assistance;Sit to/from stand Lower Body Dressing Details (indicate cue type and reason): Don socks at [employer]: Moderate assistance Toilet Transfer Details (indicate cue type and reason): simulated toilet transfer from bed to recliner. Required Max cues for proper use of RW for safe transfer.          Functional mobility during ADLs: Moderate assistance;+2 for physical assistance;+2 for safety/equipment General ADL Comments: pt limited by cognition, generalized weakness, poor balance and decreased safety awareness.     Vision       Perception     Praxis      Cognition Arousal/Alertness: Awake/alert Behavior During Therapy: WFL for tasks assessed/performed Overall Cognitive Status: Impaired/Different from baseline Area of Impairment: Orientation;Attention;Memory;Following commands;Safety/judgement;Awareness;Problem solving                 Orientation Level: Disoriented to;Place;Time;Situation Current Attention Level: Sustained Memory: Decreased recall of precautions;Decreased short-term memory Following Commands: Follows one step commands inconsistently Safety/Judgement: Decreased awareness of deficits;Decreased awareness of safety Awareness: Intellectual Problem Solving: Slow processing;Decreased initiation;Difficulty sequencing;Requires verbal cues;Requires tactile cues General Comments: hx  of cognitive decline, required several cues throughout session for safety.        Exercises Exercises: General Upper Extremity;General Lower Extremity General Exercises - Upper  Extremity Shoulder Flexion: AROM;Right;Left;5 reps;Seated Shoulder Extension: AROM;Right;Left;5 reps;Seated Shoulder ADduction: AROM;Right;Left;5 reps;Seated Shoulder Horizontal ADduction: AROM;Right;Left;5 reps;Seated Elbow Flexion: AROM;Right;5 reps;Left;Seated Elbow Extension: AROM;Right;Left;5 reps;Seated General Exercises - Lower Extremity Hip Flexion/Marching: AROM;Seated;Standing;Both(In sitting and standing with RW to prepare for func. mob)   Shoulder Instructions       General Comments Pt required 1 step commands throughout session and reminders for safety for initation and completion of tasks.     Pertinent Vitals/ Pain       Pain Assessment: No/denies pain Faces Pain Scale: No hurt  Home Living                                          Prior Functioning/Environment              Frequency  Min 2X/week        Progress Toward Goals  OT Goals(current goals can now be found in the care plan section)  Progress towards OT goals: Progressing toward goals  Acute Rehab OT Goals Patient Stated Goal: wants to go home OT Goal Formulation: Patient unable to participate in goal setting Time For Goal Achievement: 09/20/18 Potential to Achieve Goals: Fair ADL Goals Pt Will Perform Grooming: with set-up;sitting Pt Will Transfer to Toilet: with min assist;stand pivot transfer;bedside commode Pt Will Perform Toileting - Clothing Manipulation and hygiene: sit to/from stand;with mod assist Additional ADL Goal #1: Pt will follow 1 step commands with 75% accuracy in order to maximize participation in ADLs. Additional ADL Goal #2: Pt will complete bed moiblity with supervision and maintain sitting balance EOB with supervision as precursor to ADLs.  Plan Discharge plan remains appropriate;Frequency remains appropriate    Co-evaluation                 AM-PAC OT "6 Clicks" Daily Activity     Outcome Measure   Help from another person eating meals?: A  Little Help from another person taking care of personal grooming?: A Little Help from another person toileting, which includes using toliet, bedpan, or urinal?: Total Help from another person bathing (including washing, rinsing, drying)?: A Lot Help from another person to put on and taking off regular upper body clothing?: A Lot Help from another person to put on and taking off regular lower body clothing?: A Lot 6 Click Score: 13    End of Session Equipment Utilized During Treatment: Gait belt  OT Visit Diagnosis: Other abnormalities of gait and mobility (R26.89);Other symptoms and signs involving cognitive function;Repeated falls (R29.6)   Activity Tolerance Patient tolerated treatment well   Patient Left with call bell/phone within reach;in chair;with chair alarm set   Nurse Communication Mobility status        Time: 1001-1026 OT Time Calculation (min): 25 min  Charges: OT General Charges $OT Visit: 1 Visit OT Treatments $Self Care/Home Management : 23-37 mins  [place] OldEvan Janah Mcculloh, MSOT, OTR/L  Supplemental Rehabilitation Services  [phone]   Zigmund Danielvan M Priyansh Pry 09/09/2018, 10:39 AM

## 2018-09-09 NOTE — Progress Notes (Signed)
   09/09/18 0100  What Happened  Was fall witnessed? No  Was patient injured? No  Patient found on floor  Found by Staff-comment Lowella Bandy, RN)  Stated prior activity bathroom-unassisted  Follow Up  MD notified Blount, RN  Time MD notified 0600  Family notified Yes-comment  Time family notified 0600  Additional tests No  Adult Fall Risk Assessment  Risk Factor Category (scoring not indicated) History of more than one fall within 6 months before admission (document High fall risk);Fall has occurred during this admission (document High fall risk)  Patient Fall Risk Level High fall risk  Adult Fall Risk Interventions  Required Bundle Interventions *See Row Information* High fall risk - low, moderate, and high requirements implemented  Additional Interventions Room near nurses station;PT/OT need assessed if change in mobility from baseline  Screening for Fall Injury Risk (To be completed on HIGH fall risk patients) - Assessing Need for Low Bed  Risk For Fall Injury- Low Bed Criteria 85 years or older;Previous fall this admission  Will Implement Low Bed and Floor Mats Yes  Vitals  BP (!) 100/54  BP Method Automatic  Pulse Rate (!) 101  Pain Assessment  Pain Scale 0-10  Neurological  Neuro (WDL) X  Level of Consciousness Alert  Orientation Level Oriented to person;Disoriented to time;Oriented to place;Disoriented to situation  Quarry manager Clear  R Pupil Size (mm) 3  R Pupil Shape Round  R Pupil Reaction Brisk  L Pupil Size (mm) 3  L Pupil Shape Round  L Pupil Reaction Brisk  Facial Symmetry Symmetrical  R Hand Grip Moderate  L Hand Grip Moderate   Gl[place] Coma Scale  Eye Opening 4  Best Verbal Response (NON-intubated) 5  Best Motor Response 6  [place] Coma Scale Score 15

## 2018-09-09 NOTE — Progress Notes (Signed)
PROGRESS NOTE  Carolin GuernseyJames G Hoffmann WGN:[id number] DOB: 10-Aug-1924 DOA: 09/05/2018 PCP: Tomi BambergerFuller, Susan, NP (Inactive)   LOS: 4 days   Brief narrative: Arthur ParisJames G Cofferis a 85 y.o.malewithno significant past medical history, patient has not been to a doctor in over 10 years, does not take any medications on a regular basis who lives by herself (but has a daughter that checks on her frequently) with mild memory and cognitive decline for months to a year presented to hospital this time with increasing confusion for 3 to 4 days with frequent falls.  In the ED,vital signs stable, labs notable for creatinine of 1.3, white count of 12.9, hemoglobin of 9.3, abnormal UA with positive nitrite, leuk esterase, many bacteria and 6-10 WBCs,CT head noted diffuse atrophy, no acute abnormalities,SARS COVID-19 PCR was negative. Patient was agitated in the emergency room was given Ativan IV x1 however continues to be angry agitated and confused.  Patient was then admitted to hospital for further evaluation and treatment.  Subjective: Patient denies interval complaints.  No shortness of breath, chest pain, fever or chills.  Assessment/Plan:  Active Problems:   Acute metabolic encephalopathy   Sepsis secondary to UTI (HCC)  Sepsis, present on admission likely secondary to Klebsiella pneumonia urinary tract infection. Patient presented with encephalopathy leukocytosis and abnormal urinalysis.  Urine culture with Klebsiella pneumoniae.  COVID-19 was negative.  Chest x-ray was negative for acute infection.  Currently on Keflex.  Initially received Rocephin.  Currently afebrile.  WBC of 8.5.  Acute metabolic encephalopathy With history of multiple falls recently.  Likely secondary to UTI. CT head/neck with diffuse atrophy otherwise unrevealing.  Troponins less than 0.03.  TSH within normal limits, ammonia level normal.  Patient also likely has underlying dementia.  It appears that patient is more communicative alert  awake today.  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 Evaluated by PT/OT who recommend SNF placement.  Patient currently lives by herself.  Acute kidney injury present on admission: resolved Received gentle IV fluid hydration.  Creatinine of 0.85 on 09/08/2018.  VTE Prophylaxis: Lovenox  Code Status: DNR  Family Communication: None  Disposition Plan: Skilled nursing facility as per PT OT recommendations.  Consultants:  None  Procedures:  None  Antibiotics: Anti-infectives (From admission, onward)   Start     Dose/Rate Route Frequency Ordered Stop   09/07/18 1400  cephALEXin (KEFLEX) capsule 500 mg     500 mg Oral Every 12 hours 09/07/18 1258 09/10/18 0959   09/07/18 1215  ciprofloxacin (CIPRO) tablet 500 mg  Status:  Discontinued     500 mg Oral 2 times daily 09/07/18 1213 09/07/18 1258   09/06/18 1800  cefTRIAXone (ROCEPHIN) 1 g in sodium chloride 0.9 % 100 mL IVPB  Status:  Discontinued     1 g 200 mL/hr over 30 Minutes Intravenous Every 24 hours 09/05/18 1704 09/07/18 1213   09/05/18 1600  cefTRIAXone (ROCEPHIN) 1 g in sodium chloride 0.9 % 100 mL IVPB     1 g 200 mL/hr over 30 Minutes Intravenous  Once 09/05/18 1547 09/05/18 1711      Objective: Vitals:   09/09/18 0357 09/09/18 0915  BP: (!) 93/52 99/65  Pulse: 76 79  Resp: 18 18  Temp: (!) 97.4 F (36.3 C) 97.6 F (36.4 C)  SpO2: 93% 95%    Intake/Output Summary (Last 24 hours) at 09/09/2018 1121 Last data filed at 09/09/2018 1101 Gross per 24 hour  Intake 660 ml  Output  250 ml  Net 410 ml   Filed Weights   09/08/18 1500 09/08/18 2031  Weight: 80.6 kg 80.8 kg   Body mass index is 27.91 kg/m.   Physical Exam: GENERAL: Patient is alert awake and communicative.  Not in obvious distress. HENT: No scleral pallor or icterus. Pupils equally reactive to light. Oral mucosa is moist NECK: is supple, no palpable thyroid enlargement. CHEST: Clear to  auscultation. No crackles or wheezes. Non tender on palpation. Diminished breath sounds bilaterally. CVS: S1 and S2 heard, no murmur. Regular rate and rhythm. No pericardial rub. ABDOMEN: Soft, non-tender, bowel sounds are present. No palpable hepato-splenomegaly. EXTREMITIES: No edema. CNS: Cranial nerves are intact. No focal motor or sensory deficits.  Alert awake and oriented to self. SKIN: warm and dry without rashes.  Data Review: I have personally reviewed the following laboratory data and studies,  CBC: Recent Labs  Lab 09/05/18 1204 09/06/18 0556 09/07/18 0237  WBC 12.9* 8.1 8.5  NEUTROABS 9.4*  --   --   HGB 9.3* 7.9* 8.2*  HCT 33.3* 28.8* 29.8*  MCV 84.9 86.2 85.4  PLT 251 214 219   Basic Metabolic Panel: Recent Labs  Lab 09/05/18 1204 09/06/18 0556 09/07/18 0237 09/08/18 0438  NA 136 139 138 137  K 4.1 3.7 3.6 3.9  CL 105 105 108 103  CO2 GLUCOSE 117* 87 95 85  BUN 29* CREATININE 1.33* 0.89 0.94 0.85  CALCIUM 8.9 7.8* 7.8* 8.1*  MG 2.1  --   --  2.1   Liver Function Tests: Recent Labs  Lab 09/05/18 1204  AST 19  ALT 13  ALKPHOS 102  BILITOT 1.9*  PROT 6.8  ALBUMIN 2.5*   Recent Labs  Lab 09/05/18 1204  LIPASE 21   Recent Labs  Lab 09/05/18 1204  AMMONIA 11   Cardiac Enzymes: Recent Labs  Lab 09/05/18 1204  CKTOTAL 116  TROPONINI <0.03   BNP (last 3 results) No results for input(s): BNP in the last 8760 hours.  ProBNP (last 3 results) No results for input(s): PROBNP in the last 8760 hours.  CBG: Recent Labs  Lab 09/05/18 1153  GLUCAP 106*   Recent Results (from the past 240 hour(s))  SARS Coronavirus 2 (CEPHEID - Performed in [facility] hospital lab), Hosp Order     Status: None   Collection Time: 09/05/18  1:10 PM  Result Value Ref Range Status   SARS Coronavirus 2 NEGATIVE NEGATIVE Final    Comment: (NOTE) If result is NEGATIVE SARS-CoV-2 target nucleic acids are NOT DETECTED. The SARS-CoV-2  RNA is generally detectable in upper and lower  respiratory specimens during the acute phase of infection. The lowest  concentration of SARS-CoV-2 viral copies this assay can detect is 250  copies / mL. A negative result does not preclude SARS-CoV-2 infection  and should not be used as the sole basis for treatment or other  patient management decisions.  A negative result may occur with  improper specimen collection / handling, submission of specimen other  than nasopharyngeal swab, presence of viral mutation(s) within the  areas targeted by this assay, and inadequate number of viral copies  (<250 copies / mL). A negative result must be combined with clinical  observations, patient history, and epidemiological information. If result is POSITIVE SARS-CoV-2 target nucleic acids are DETECTED. The SARS-CoV-2 RNA is generally detectable in upper and lower  respiratory specimens dur ing the acute phase of infection.  Positive  results are indicative of active infection with SARS-CoV-2.  Clinical  correlation with patient history and other diagnostic information is  necessary to determine patient infection status.  Positive results do  not rule out bacterial infection or co-infection with other viruses. If result is PRESUMPTIVE POSTIVE SARS-CoV-2 nucleic acids MAY BE PRESENT.   A presumptive positive result was obtained on the submitted specimen  and confirmed on repeat testing.  While 2019 novel coronavirus  (SARS-CoV-2) nucleic acids may be present in the submitted sample  additional confirmatory testing may be necessary for epidemiological  and / or clinical management purposes  to differentiate between  SARS-CoV-2 and other Sarbecovirus currently known to infect humans.  If clinically indicated additional testing with an alternate test  methodology [phone]) is advised. The SARS-CoV-2 RNA is generally  detectable in upper and lower respiratory sp ecimens during the acute  phase of  infection. The expected result is Negative. Fact Sheet for Patients:  BoilerBrush.com.cy Fact Sheet for Healthcare Providers: https://pope.com/ This test is not yet approved or cleared by the [place] FDA and has been authorized for detection and/or diagnosis of SARS-CoV-2 by FDA under an Emergency Use Authorization (EUA).  This EUA will remain in effect (meaning this test can be used) for the duration of the COVID-19 declaration under Section 564(b)(1) of the Act, 21 U.S.C. section 360bbb-3(b)(1), unless the authorization is terminated or revoked sooner. Performed at [facility], [address] [place], [place] [address]   Urine culture     Status: Abnormal   Collection Time: 09/05/18  2:40 PM  Result Value Ref Range Status   Specimen Description URINE, CATHETERIZED  Final   Special Requests   Final    NONE Performed at [facility], [address], [place], [place] [address]    Culture >=100,000 COLONIES/mL KLEBSIELLA PNEUMONIAE (A)  Final   Report Status 09/07/2018 FINAL  Final   Organism ID, Bacteria KLEBSIELLA PNEUMONIAE (A)  Final      Susceptibility   Klebsiella pneumoniae - MIC*    AMPICILLIN >=32 RESISTANT Resistant     CEFAZOLIN <=4 SENSITIVE Sensitive     CEFTRIAXONE <=1 SENSITIVE Sensitive     CIPROFLOXACIN <=0.25 SENSITIVE Sensitive     GENTAMICIN <=1 SENSITIVE Sensitive     IMIPENEM <=0.25 SENSITIVE Sensitive     NITROFURANTOIN 128 RESISTANT Resistant     TRIMETH/SULFA <=20 SENSITIVE Sensitive     AMPICILLIN/SULBACTAM 4 SENSITIVE Sensitive     PIP/TAZO <=4 SENSITIVE Sensitive     Extended ESBL NEGATIVE Sensitive     * >=100,000 COLONIES/mL KLEBSIELLA PNEUMONIAE     Studies: No results found.  Scheduled Meds: . cephALEXin  500 mg Oral Q12H  . enoxaparin (LOVENOX) injection  40 mg Subcutaneous Q24H    Continuous Infusions:   Joycelyn Das, MD  [facility] 09/09/2018

## 2018-09-10 LAB — CBC
HCT: 29.2 % — ABNORMAL LOW (ref 39.0–52.0)
Hemoglobin: 8 g/dL — ABNORMAL LOW (ref 13.0–17.0)
MCH: 23.7 pg — ABNORMAL LOW (ref 26.0–34.0)
MCHC: 27.4 g/dL — ABNORMAL LOW (ref 30.0–36.0)
MCV: 86.4 fL (ref 80.0–100.0)
Platelets: 207 10*3/uL (ref 150–400)
RBC: 3.38 MIL/uL — ABNORMAL LOW (ref 4.22–5.81)
RDW: 19.4 % — ABNORMAL HIGH (ref 11.5–15.5)
WBC: 6.6 10*3/uL (ref 4.0–10.5)
nRBC: 0 % (ref 0.0–0.2)

## 2018-09-10 LAB — BASIC METABOLIC PANEL
Anion gap: 6 (ref 5–15)
BUN: 19 mg/dL (ref 8–23)
CO2: 25 mmol/L (ref 22–32)
Calcium: 7.9 mg/dL — ABNORMAL LOW (ref 8.9–10.3)
Chloride: 108 mmol/L (ref 98–111)
Creatinine, Ser: 1.02 mg/dL (ref 0.61–1.24)
GFR calc Af Amer: >60 mL/min (ref >60)
GFR calc non Af Amer: >60 mL/min (ref >60)
Glucose, Bld: 88 mg/dL (ref 70–99)
Potassium: 3.9 mmol/L (ref 3.5–5.1)
Sodium: 139 mmol/L (ref 135–145)

## 2018-09-10 LAB — SARS CORONAVIRUS 2 BY RT PCR (HOSPITAL ORDER, PERFORMED IN ~~LOC~~ HOSPITAL LAB): SARS Coronavirus 2: NEGATIVE

## 2018-09-10 MED ORDER — ACETAMINOPHEN 325 MG PO TABS: 650.0000 mg | ORAL_TABLET | Freq: Four times a day (QID) | ORAL | Status: AC | PRN

## 2018-09-10 NOTE — Progress Notes (Signed)
Report given to [facility] at [place].

## 2018-09-10 NOTE — Discharge Summary (Signed)
Physician Discharge Summary  Damani Kelemen Nydam VWU:[id number] DOB: October 22, 1924 DOA: 09/05/2018  PCP: Tomi Bamberger, NP (Inactive)  Admit date: 09/05/2018 Discharge date: 09/10/2018  Admitted From: Home  Discharge disposition: Skilled nursing facility   Recommendations for Outpatient Follow-Up:    Follow up with your primary care provider at the skilled nursing facility in 3 to 5 days   Discharge Diagnosis:   Active Problems:   Acute metabolic encephalopathy-resolved   Sepsis secondary to UTI (HCC)-resolved Klebsiella UTI-treated Acute kidney injury resolved  Discharge Condition: Improved.  Diet recommendation:  Regular.  Wound care: None.  Code status: DNR   History of Present Illness:   Arthur Sanger Cofferis a 85 y.o.malewithno significant past medical history, patient has not been to a doctor in over 10 years, does not take any medications on a regular basis who lives by herself (but has a daughter that checks on her frequently) with mild memory and cognitive decline for months to a year presented to hospital this time with increasing confusion for 3 to 4 days with frequent falls.  In the ED,vital signs stable, labs notable for creatinine of 1.3, white count of 12.9, hemoglobin of 9.3, abnormal UA with positive nitrite, leuk esterase, many bacteria and 6-10 WBCs,CT head noted diffuse atrophy, no acute abnormalities,SARS COVID-19 PCR was negative. Patient was agitated in the emergency room was given Ativan IV x1 however he continued to be angry agitated and confused.  Patient was then admitted to hospital for further evaluation and treatment.  Hospital Course:  Patient was admitted to hospital and following conditions were addressed during hospitalization,  Sepsis, present on admission likely secondary to Klebsiella pneumonia urinary tract infection. Patient presented with encephalopathy, leukocytosis and abnormal urinalysis.  Urine culture with Klebsiella pneumoniae.   COVID-19 was negative x2.  Chest x-ray was negative for acute infection.    Completed the course of antibiotics with Keflex.  Initially received Rocephin.  Currently afebrile.  WBC of 6.6.  Acute metabolic encephalopathy-resolved  With history of multiple falls recently.  Likely secondary to UTI.CT head/neck with diffuse atrophy otherwise unrevealing. Troponins less than 0.03. TSH within normal limits, ammonia level normal. Patient also likely has underlying dementia.   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 Evaluated by PT/OT who recommend SNF placement.    Acute kidney injury present on admission: resolved Received gentle IV fluid hydration.  Creatinine of 1.02 on 09/10/2018  Disposition.  At this time patient is stable disposition to skilled nursing facility.  I also spoke with the patient's daughter Cynda Acres on the phone and updated her about the clinical condition of the patient and the plan for disposition.  Medical Consultants:    None.  Subjective:   Today, patient feels okay.  Denies interval complaints.  Denies any fever, nausea or vomiting.  Discharge Exam:   Vitals:   09/09/18 2003 09/10/18 0439  BP: 106/60 (!) 100/57  Pulse: 80 76  Resp: 18 18  Temp: 97.7 F (36.5 C) 97.7 F (36.5 C)  SpO2: 99% 94%   Vitals:   09/09/18 0915 09/09/18 1559 09/09/18 2003 09/10/18 0439  BP: 99/65 (!) 115/59 106/60 (!) 100/57  Pulse: 79 82 80 76  Resp: Temp: 97.6 F (36.4 C) (!) 97.5 F (36.4 C) 97.7 F (36.5 C) 97.7 F (36.5 C)  TempSrc: Oral Oral Oral   SpO2: 95% 96% 99% 94%  Weight:      Height:  General exam: Appears calm and comfortable ,Not in distress HEENT:PERRL,Oral mucosa moist Respiratory system: Bilateral equal air entry, normal vesicular breath sounds, no wheezes or crackles  Cardiovascular system: S1 & S2 heard, RRR.  Gastrointestinal system: Abdomen is nondistended, soft and  nontender. No organomegaly or masses felt. Normal bowel sounds heard. Central nervous system: Alert awake and communicative.. No focal neurological deficits. Extremities: No edema, no clubbing ,no cyanosis, distal peripheral pulses palpable. Skin: No rashes, lesions or ulcers,no icterus ,no pallor MSK: Normal muscle bulk,tone ,power    Procedures:    None  The results of significant diagnostics from this hospitalization (including imaging, microbiology, ancillary and laboratory) are listed below for reference.     Diagnostic Studies:   Dg Chest 2 View  Result Date: 09/05/2018 CLINICAL DATA:  Fall and altered mental status. EXAM: CHEST - 2 VIEW COMPARISON:  None FINDINGS: Two views of the chest demonstrate low lung volumes. Coarse lung markings may be chronic. Heart size is upper limits of normal and likely accentuated by low inspiratory effort. However, there may be an enlarged ascending thoracic aorta. Trachea is midline. Negative for a pneumothorax. Bridging osteophytes in the mid and lower thoracic spine region. No large pleural effusions. IMPRESSION: No active cardiopulmonary disease. Concern for enlarged ascending thoracic aorta. Electronically Signed   By: Richarda Overlie M.D.   On: 09/05/2018 13:37   Ct Head Wo Contrast  Result Date: 09/05/2018 CLINICAL DATA:  85 year old with multiple falls. EXAM: CT HEAD WITHOUT CONTRAST CT CERVICAL SPINE WITHOUT CONTRAST TECHNIQUE: Multidetector CT imaging of the head and cervical spine was performed following the standard protocol without intravenous contrast. Multiplanar CT image reconstructions of the cervical spine were also generated. COMPARISON:  08/28/2012 FINDINGS: CT HEAD FINDINGS Brain: Diffuse cerebral atrophy. Extensive low density throughout the white matter and similar to the prior examination. No evidence for acute hemorrhage, mass lesion, midline shift, hydrocephalus or large infarct. Vascular: No hyperdense vessel or unexpected  calcification. Skull: Normal. Negative for fracture or focal lesion. Sinuses/Orbits: Small amount of mucosal disease in the sphenoid sinuses. Other: None. CT CERVICAL SPINE FINDINGS Alignment: Normal. Skull base and vertebrae: No acute fracture. No primary bone lesion or focal pathologic process. Soft tissues and spinal canal: No prevertebral fluid or swelling. No visible canal hematoma. Disc levels: Mild disc space narrowing throughout the cervical spine, most prominent at C3-C4. Facet arthropathy along the right side C2-C3. Upper chest: Negative. Other: None IMPRESSION: 1. No acute intracranial abnormality. 2. No acute bone abnormality in cervical spine. 3. Diffuse cerebral atrophy. Chronic white matter disease likely represents chronic small vessel ischemic changes. Electronically Signed   By: Richarda Overlie M.D.   On: 09/05/2018 14:22   Ct Cervical Spine Wo Contrast  Result Date: 09/05/2018 CLINICAL DATA:  85 year old with multiple falls. EXAM: CT HEAD WITHOUT CONTRAST CT CERVICAL SPINE WITHOUT CONTRAST TECHNIQUE: Multidetector CT imaging of the head and cervical spine was performed following the standard protocol without intravenous contrast. Multiplanar CT image reconstructions of the cervical spine were also generated. COMPARISON:  08/28/2012 FINDINGS: CT HEAD FINDINGS Brain: Diffuse cerebral atrophy. Extensive low density throughout the white matter and similar to the prior examination. No evidence for acute hemorrhage, mass lesion, midline shift, hydrocephalus or large infarct. Vascular: No hyperdense vessel or unexpected calcification. Skull: Normal. Negative for fracture or focal lesion. Sinuses/Orbits: Small amount of mucosal disease in the sphenoid sinuses. Other: None. CT CERVICAL SPINE FINDINGS Alignment: Normal. Skull base and vertebrae: No acute fracture. No primary bone lesion or  focal pathologic process. Soft tissues and spinal canal: No prevertebral fluid or swelling. No visible canal hematoma.  Disc levels: Mild disc space narrowing throughout the cervical spine, most prominent at C3-C4. Facet arthropathy along the right side C2-C3. Upper chest: Negative. Other: None IMPRESSION: 1. No acute intracranial abnormality. 2. No acute bone abnormality in cervical spine. 3. Diffuse cerebral atrophy. Chronic white matter disease likely represents chronic small vessel ischemic changes. Electronically Signed   By: Richarda Overlie M.D.   On: 09/05/2018 14:22     Labs:   Basic Metabolic Panel: Recent Labs  Lab 09/05/18 1204 09/06/18 0556 09/07/18 0237 09/08/18 0438 09/10/18 0333  NA 136 139 138 137 139  K 4.1 3.7 3.6 3.9 3.9  CL 105 105 108 103 108  CO2 GLUCOSE 117* 87 95 85 88  BUN 29* CREATININE 1.33* 0.89 0.94 0.85 1.02  CALCIUM 8.9 7.8* 7.8* 8.1* 7.9*  MG 2.1  --   --  2.1  --    GFR Estimated Creatinine Clearance: 47.1 mL/min (by C-G formula based on SCr of 1.02 mg/dL). Liver Function Tests: Recent Labs  Lab 09/05/18 1204  AST 19  ALT 13  ALKPHOS 102  BILITOT 1.9*  PROT 6.8  ALBUMIN 2.5*   Recent Labs  Lab 09/05/18 1204  LIPASE 21   Recent Labs  Lab 09/05/18 1204  AMMONIA 11   Coagulation profile Recent Labs  Lab 09/05/18 1204  INR 1.1    CBC: Recent Labs  Lab 09/05/18 1204 09/06/18 0556 09/07/18 0237 09/10/18 0333  WBC 12.9* 8.1 8.5 6.6  NEUTROABS 9.4*  --   --   --   HGB 9.3* 7.9* 8.2* 8.0*  HCT 33.3* 28.8* 29.8* 29.2*  MCV 84.9 86.2 85.4 86.4  PLT 251 214 219 207   Cardiac Enzymes: Recent Labs  Lab 09/05/18 1204  CKTOTAL 116  TROPONINI <0.03   BNP: Invalid input(s): POCBNP CBG: Recent Labs  Lab 09/05/18 1153  GLUCAP 106*   D-Dimer No results for input(s): DDIMER in the last 72 hours. Hgb A1c No results for input(s): HGBA1C in the last 72 hours. Lipid Profile No results for input(s): CHOL, HDL, LDLCALC, TRIG, CHOLHDL, LDLDIRECT in the last 72 hours. Thyroid function studies No results for input(s):  TSH, T4TOTAL, T3FREE, THYROIDAB in the last 72 hours.  Invalid input(s): FREET3 Anemia work up No results for input(s): VITAMINB12, FOLATE, FERRITIN, TIBC, IRON, RETICCTPCT in the last 72 hours. Microbiology Recent Results (from the past 240 hour(s))  SARS Coronavirus 2 (CEPHEID - Performed in [facility] hospital lab), Hosp Order     Status: None   Collection Time: 09/05/18  1:10 PM  Result Value Ref Range Status   SARS Coronavirus 2 NEGATIVE NEGATIVE Final    Comment: (NOTE) If result is NEGATIVE SARS-CoV-2 target nucleic acids are NOT DETECTED. The SARS-CoV-2 RNA is generally detectable in upper and lower  respiratory specimens during the acute phase of infection. The lowest  concentration of SARS-CoV-2 viral copies this assay can detect is 250  copies / mL. A negative result does not preclude SARS-CoV-2 infection  and should not be used as the sole basis for treatment or other  patient management decisions.  A negative result may occur with  improper specimen collection / handling, submission of specimen other  than nasopharyngeal swab, presence of viral mutation(s) within the  areas targeted by this assay, and inadequate number of viral copies  (<250  copies / mL). A negative result must be combined with clinical  observations, patient history, and epidemiological information. If result is POSITIVE SARS-CoV-2 target nucleic acids are DETECTED. The SARS-CoV-2 RNA is generally detectable in upper and lower  respiratory specimens dur ing the acute phase of infection.  Positive  results are indicative of active infection with SARS-CoV-2.  Clinical  correlation with patient history and other diagnostic information is  necessary to determine patient infection status.  Positive results do  not rule out bacterial infection or co-infection with other viruses. If result is PRESUMPTIVE POSTIVE SARS-CoV-2 nucleic acids MAY BE PRESENT.   A presumptive positive result was obtained on the  submitted specimen  and confirmed on repeat testing.  While 2019 novel coronavirus  (SARS-CoV-2) nucleic acids may be present in the submitted sample  additional confirmatory testing may be necessary for epidemiological  and / or clinical management purposes  to differentiate between  SARS-CoV-2 and other Sarbecovirus currently known to infect humans.  If clinically indicated additional testing with an alternate test  methodology [phone]) is advised. The SARS-CoV-2 RNA is generally  detectable in upper and lower respiratory sp ecimens during the acute  phase of infection. The expected result is Negative. Fact Sheet for Patients:  BoilerBrush.com.cy Fact Sheet for Healthcare Providers: https://pope.com/ This test is not yet approved or cleared by the [place] FDA and has been authorized for detection and/or diagnosis of SARS-CoV-2 by FDA under an Emergency Use Authorization (EUA).  This EUA will remain in effect (meaning this test can be used) for the duration of the COVID-19 declaration under Section 564(b)(1) of the Act, 21 U.S.C. section 360bbb-3(b)(1), unless the authorization is terminated or revoked sooner. Performed at [facility], [address], [place], [place] [address]   Urine culture     Status: Abnormal   Collection Time: 09/05/18  2:40 PM  Result Value Ref Range Status   Specimen Description URINE, CATHETERIZED  Final   Special Requests   Final    NONE Performed at [facility], [address], [place], [place] [address]    Culture >=100,000 COLONIES/mL KLEBSIELLA PNEUMONIAE (A)  Final   Report Status 09/07/2018 FINAL  Final   Organism ID, Bacteria KLEBSIELLA PNEUMONIAE (A)  Final      Susceptibility   Klebsiella pneumoniae - MIC*    AMPICILLIN >=32 RESISTANT Resistant     CEFAZOLIN <=4 SENSITIVE Sensitive     CEFTRIAXONE <=1 SENSITIVE Sensitive     CIPROFLOXACIN <=0.25 SENSITIVE Sensitive      GENTAMICIN <=1 SENSITIVE Sensitive     IMIPENEM <=0.25 SENSITIVE Sensitive     NITROFURANTOIN 128 RESISTANT Resistant     TRIMETH/SULFA <=20 SENSITIVE Sensitive     AMPICILLIN/SULBACTAM 4 SENSITIVE Sensitive     PIP/TAZO <=4 SENSITIVE Sensitive     Extended ESBL NEGATIVE Sensitive     * >=100,000 COLONIES/mL KLEBSIELLA PNEUMONIAE  SARS Coronavirus 2 (CEPHEID - Performed in [facility] hospital lab), Hosp Order     Status: None   Collection Time: 09/10/18 12:23 PM  Result Value Ref Range Status   SARS Coronavirus 2 NEGATIVE NEGATIVE Final    Comment: (NOTE) If result is NEGATIVE SARS-CoV-2 target nucleic acids are NOT DETECTED. The SARS-CoV-2 RNA is generally detectable in upper and lower  respiratory specimens during the acute phase of infection. The lowest  concentration of SARS-CoV-2 viral copies this assay can detect is 250  copies / mL. A negative result does not preclude SARS-CoV-2 infection  and should not be used as the sole basis for treatment or other  patient management decisions.  A negative result may occur with  improper specimen collection / handling, submission of specimen other  than nasopharyngeal swab, presence of viral mutation(s) within the  areas targeted by this assay, and inadequate number of viral copies  (<250 copies / mL). A negative result must be combined with clinical  observations, patient history, and epidemiological information. If result is POSITIVE SARS-CoV-2 target nucleic acids are DETECTED. The SARS-CoV-2 RNA is generally detectable in upper and lower  respiratory specimens dur ing the acute phase of infection.  Positive  results are indicative of active infection with SARS-CoV-2.  Clinical  correlation with patient history and other diagnostic information is  necessary to determine patient infection status.  Positive results do  not rule out bacterial infection or co-infection with other viruses. If result is PRESUMPTIVE POSTIVE SARS-CoV-2  nucleic acids MAY BE PRESENT.   A presumptive positive result was obtained on the submitted specimen  and confirmed on repeat testing.  While 2019 novel coronavirus  (SARS-CoV-2) nucleic acids may be present in the submitted sample  additional confirmatory testing may be necessary for epidemiological  and / or clinical management purposes  to differentiate between  SARS-CoV-2 and other Sarbecovirus currently known to infect humans.  If clinically indicated additional testing with an alternate test  methodology [phone]([id number]) is advised. The SARS-CoV-2 RNA is generally  detectable in upper and lower respiratory sp ecimens during the acute  phase of infection. The expected result is Negative. Fact Sheet for Patients:  BoilerBrush.com.cyhttps://www.fda.gov/media/136312/download Fact Sheet for Healthcare Providers: https://pope.com/https://www.fda.gov/media/136313/download This test is not yet approved or cleared by the [place] FDA and has been authorized for detection and/or diagnosis of SARS-CoV-2 by FDA under an Emergency Use Authorization (EUA).  This EUA will remain in effect (meaning this test can be used) for the duration of the COVID-19 declaration under Section 564(b)(1) of the Act, 21 U.S.C. section 360bbb-3(b)(1), unless the authorization is terminated or revoked sooner. Performed at [facility], [address] [place], [place] [address]      Discharge Instructions:   Discharge Instructions    Diet general   Complete by:  As directed    Discharge instructions   Complete by:  As directed    Follow up with primary care provider at the skilled nursing facility in 3 to 5 days   Increase activity slowly   Complete by:  As directed      Allergies as of 09/10/2018      Reactions   Ceftriaxone    Erythema, well defined circle reaction on arm near infusion      Medication List    STOP taking these medications   BC HEADACHE POWDER PO     TAKE these medications   acetaminophen 325 MG tablet  Commonly known as:  TYLENOL Take 2 tablets (650 mg total) by mouth every 6 (six) hours as needed for mild pain (or Fever >/= 101).      Contact information for after-discharge care    Destination    [facility] Preferred SNF .   Service:  Skilled Nursing Contact information: [address] [place] [id number] [phone]              Time coordinating discharge: 39 minutes  Signed:  Jacen Carlini  [facility] 09/10/2018, 1:54 PM

## 2018-09-10 NOTE — TOC Transition Note (Signed)
Transition of Care [facility]) - CM/SW Discharge Note **DISCHARGED TO [facility] HEALTH CARE VIA AMBULANCE   Patient Details  Name: Arthur Sims MRN: [id number] Date of Birth: 05-05-1924  Transition of Care [facility]) CM/SW Contact:  Cristobal Goldmann, LCSW Phone Number: 09/10/2018, 4:51 PM   Clinical Narrative:  Patient medically stable for discharge. Olegario Messier, admissions director at [facility] contacted and informed. Another COVID test requested and was done (negative). Discharge clinicals transmitted to facility and daughter Gavin Pound contacted and advised of discharge. Olegario Messier was provided with daughter's contact information so that they could discuss getting the admissions paperwork completed. Patient transported to facility via ambulance.    Final next level of care: Skilled Nursing Facility Barriers to Discharge: Continued Medical Work up   Patient Goals and CMS Choice Patient states their goals for this hospitalization and ongoing recovery are:: Peer daughter, her dad needs to get stronger so that he will be safe at home CMS Medicare.gov Compare Post Acute Care list provided to:: Other (Comment Required)(Daughter advised of website) Choice offered to / list presented to : Adult Children(Daughter provided with website information)  Discharge Placement  [facility] for short-term rehab                     Discharge Plan and Services In-house Referral: Clinical Social Work   Post Acute Care Choice: Skilled Nursing Facility                               Social Determinants of Health (SDOH) Interventions  No SDOH interventions needed prior to discharge.   Readmission Risk Interventions No flowsheet data found.

## 2018-09-10 NOTE — Progress Notes (Signed)
DISCHARGE NOTE HOME Arthur Sims to be discharged to Skilled nursing facility per MD order. Report given to [place] at [facility]. Skin clean, dry and intact without evidence of skin break down,  IV catheter discontinued intact. Site without signs and symptoms of complications. Dressing and pressure applied. Pt denies pain at the site currently. No complaints noted.   Patient was transported via [facility].  Margretta Sidle, RN3

## 2018-09-10 NOTE — Progress Notes (Signed)
Physical Therapy Treatment Patient Details Name: Arthur Sims MRN: [id number] DOB: Nov 22, 1924 Today's Date: 09/10/2018    History of Present Illness Pt is a 85 y.o. male with no significant past medical history, patient has not been to a doctor in over 10 years, does not take any medications on a regular basis. Presents with increased confusion and multiple falls over the last few days. Found with UTI.     PT Comments    Patient with several LOB during session requiring min to mod A to correct. Trouble standing from bed and following cues. Consider pt to be high fall risk at this time, ambulated 120' with RW and hands on guarding. Cont to rec SNF.      Follow Up Recommendations  SNF     Equipment Recommendations  Rolling walker with 5" wheels;3in1 (PT)    Recommendations for Other Services       Precautions / Restrictions Precautions Precautions: Fall Restrictions Weight Bearing Restrictions: No    Mobility  Bed Mobility Overal bed mobility: Needs Assistance Bed Mobility: Supine to Sit     Supine to sit: Min assist Sit to supine: Min assist   General bed mobility comments: Min handheld assist to pull to sit  Transfers Overall transfer level: Needs assistance Equipment used: Rolling walker (2 wheeled) Transfers: Sit to/from Stand Sit to Stand: Mod assist         General transfer comment: light mod assist to power up, cues for functional mobility with use of RW.   Ambulation/Gait Ambulation/Gait assistance: Min assist Gait Distance (Feet): 120 Feet Assistive device: Rolling walker (2 wheeled) Gait Pattern/deviations: Step-through pattern;Decreased step length - right;Decreased step length - left;Decreased stride length Gait velocity: decraesed   General Gait Details: several LOB during gait, poor obstacle avoidance with RW. hand held assist on gait belt for support the entire walk.    Stairs             Wheelchair Mobility    Modified Rankin  (Stroke Patients Only)       Balance Overall balance assessment: Needs assistance;History of Falls Sitting-balance support: No upper extremity supported;Feet supported Sitting balance-Leahy Scale: Fair     Standing balance support: Bilateral upper extremity supported;During functional activity Standing balance-Leahy Scale: Poor Standing balance comment: posterior lean                            Cognition Arousal/Alertness: Awake/alert Behavior During Therapy: WFL for tasks assessed/performed Overall Cognitive Status: Impaired/Different from baseline Area of Impairment: Orientation;Attention;Memory;Following commands;Safety/judgement;Awareness;Problem solving                 Orientation Level: Disoriented to;Place;Time;Situation Current Attention Level: Sustained Memory: Decreased recall of precautions;Decreased short-term memory Following Commands: Follows one step commands inconsistently Safety/Judgement: Decreased awareness of deficits;Decreased awareness of safety Awareness: Intellectual Problem Solving: Slow processing;Decreased initiation;Difficulty sequencing;Requires verbal cues;Requires tactile cues General Comments: hx of cognitive decline, required several cues throughout session for safety.      Exercises General Exercises - Lower Extremity Hip Flexion/Marching: (In sitting and standing with RW to prepare for func. mob)    General Comments        Pertinent Vitals/Pain Faces Pain Scale: No hurt    Home Living                      Prior Function            PT Goals (current goals can  now be found in the care plan section) Acute Rehab PT Goals Patient Stated Goal: wants to go home PT Goal Formulation: With patient Potential to Achieve Goals: Good Progress towards PT goals: Progressing toward goals    Frequency    Min 2X/week      PT Plan Current plan remains appropriate    Co-evaluation              AM-PAC PT  "6 Clicks" Mobility   Outcome Measure  Help needed turning from your back to your side while in a flat bed without using bedrails?: A Little Help needed moving from lying on your back to sitting on the side of a flat bed without using bedrails?: A Little Help needed moving to and from a bed to a chair (including a wheelchair)?: A Little Help needed standing up from a chair using your arms (e.g., wheelchair or bedside chair)?: A Little Help needed to walk in hospital room?: A Little Help needed climbing 3-5 steps with a railing? : A Lot 6 Click Score: 17    End of Session Equipment Utilized During Treatment: Gait belt Activity Tolerance: Patient tolerated treatment well Patient left: in chair;with call bell/phone within reach;with chair alarm set Nurse Communication: Mobility status PT Visit Diagnosis: Unsteadiness on feet (R26.81)     Time: [id number] PT Time Calculation (min) (ACUTE ONLY): 12 min  Charges:  $Gait Training: 8-22 mins                     Etta GrandchildSean Winnell Bento, PT, DPT Acute Rehabilitation Services Pager: [phone] Office: [phone]     Etta GrandchildSean Leonda Cristo 09/10/2018, 2:18 PM

## 2018-10-21 DEATH — deceased

## 2020-05-05 IMAGING — CT CT CERVICAL SPINE WITHOUT CONTRAST
5 of 6 series · 14 of 33 positions shown, 16 images · non-contrast
Comparison: 08/28/2012

CLINICAL DATA: [AGE] with multiple falls.

EXAM:
CT HEAD WITHOUT CONTRAST
CT CERVICAL SPINE WITHOUT CONTRAST
TECHNIQUE: Multidetector CT imaging of the head and cervical spine was
performed following the standard protocol without intravenous
contrast. Multiplanar CT image reconstructions of the cervical spine
were also generated.

[Series 5: head bone · axial · 0.45mm/px · z∈[-69,-11]mm · 2 of 87 slices shown]
[im 29/87  bone]
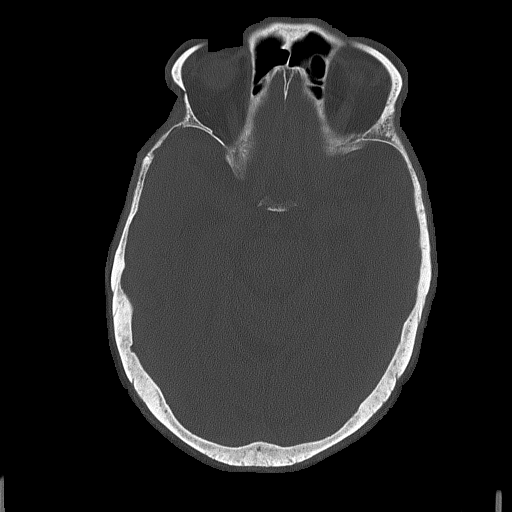
[im 58/87  bone]
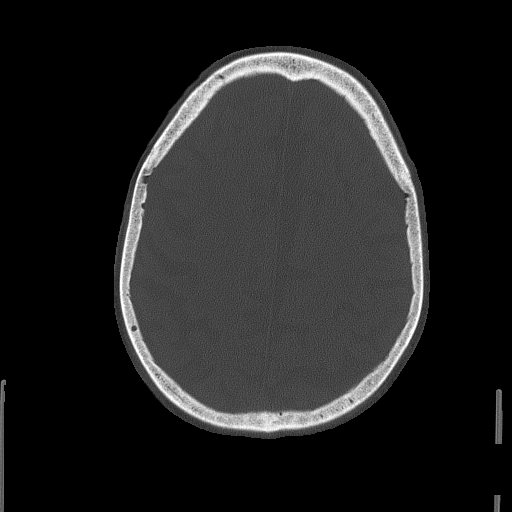

[Series 6: c_spine 2.0 st · axial · 0.34mm/px · z∈[-203,-143]mm · 2 of 90 slices shown, 3 images]
[im 30/90  soft-tissue]
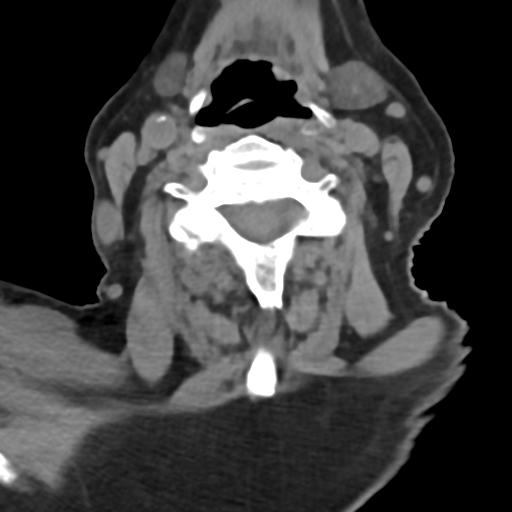
[im 30/90  bone]
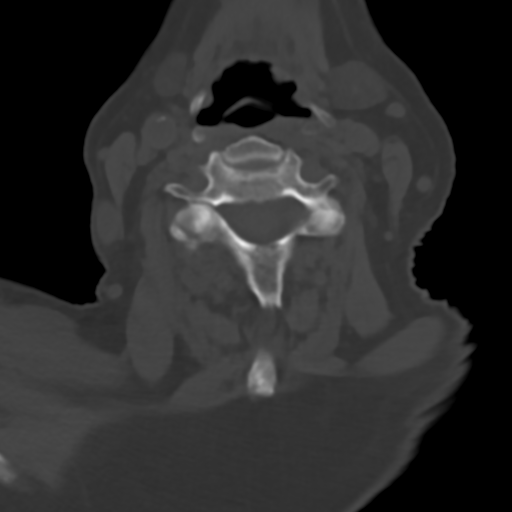
[im 60/90  bone]
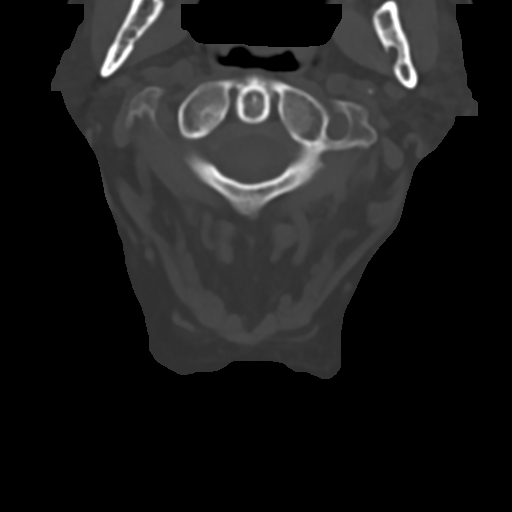

[Series 8: c_spine 2.0 sag bone · sagittal · 0.29mm/px · 5 of 61 slices shown, 6 images]
[im 21/61  bone]
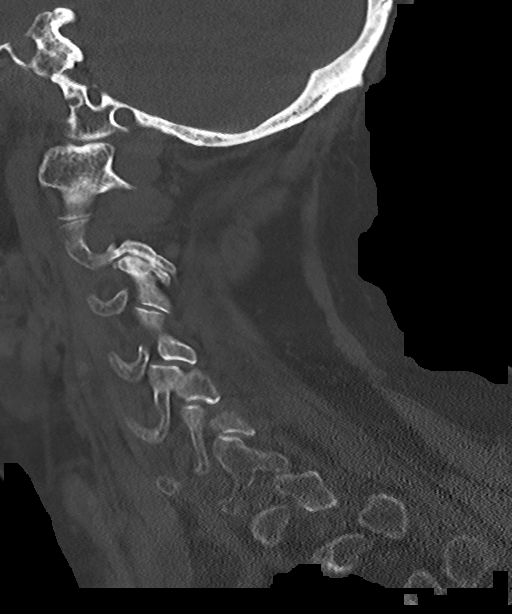
[im 26/61  bone]
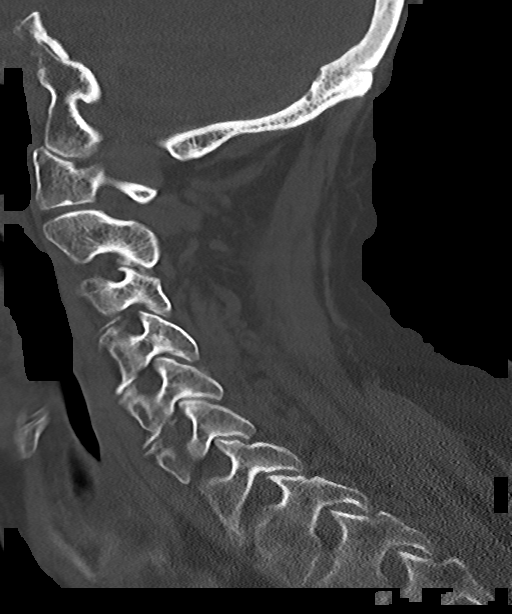
[im 31/61  soft-tissue]
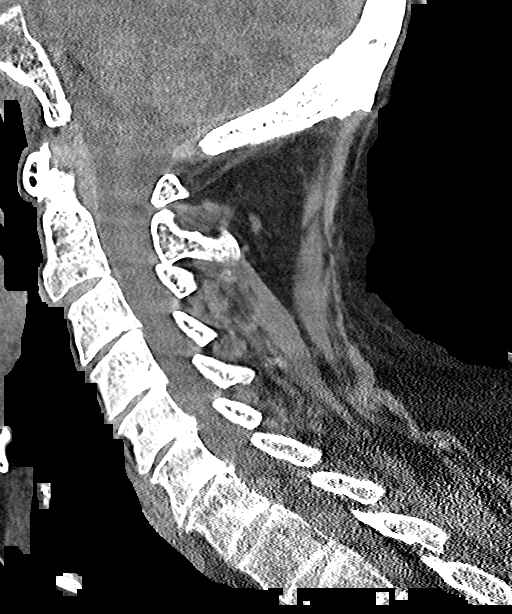
[im 31/61  bone]
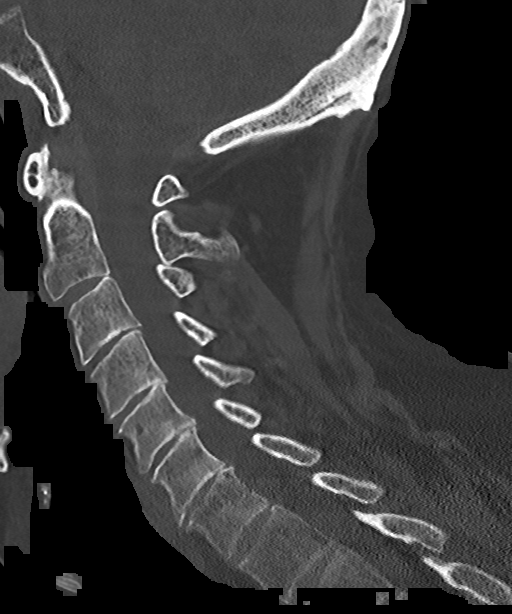
[im 36/61  bone]
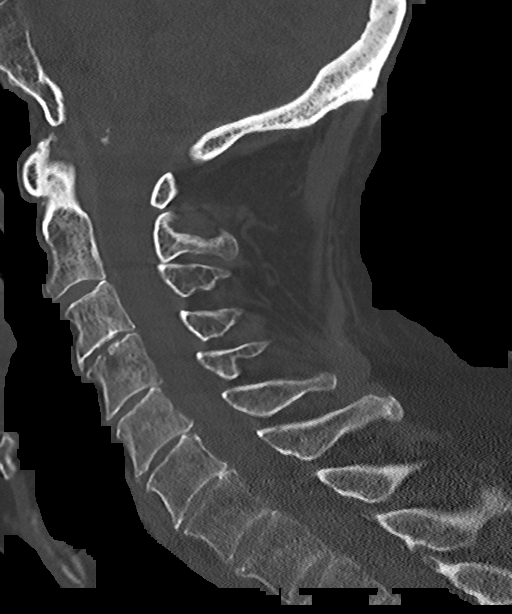
[im 41/61  bone]
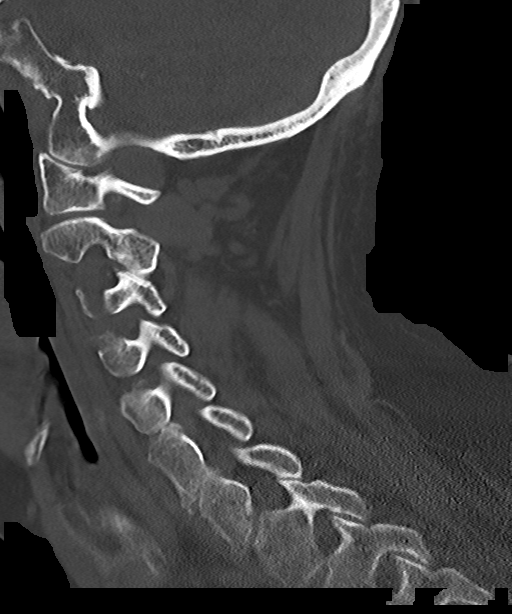

[Series 9: c_spine 2.0 cor bone · coronal · 0.26mm/px · 3 of 61 slices shown]
[im 13/61  bone]
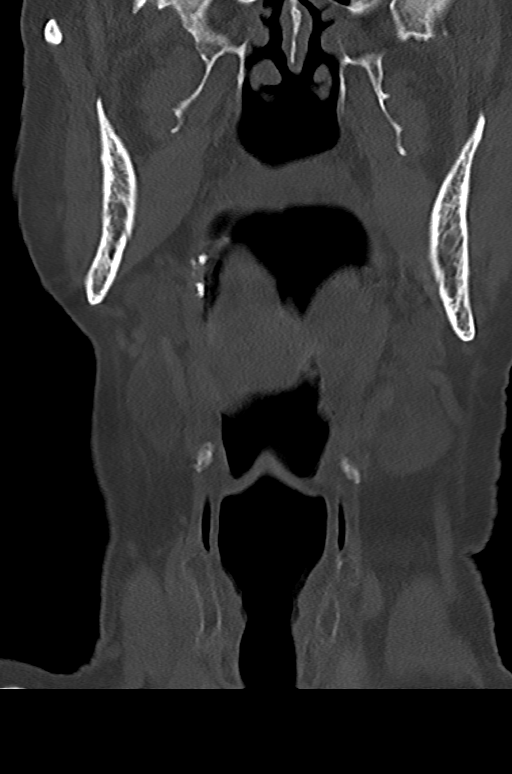
[im 25/61  bone]
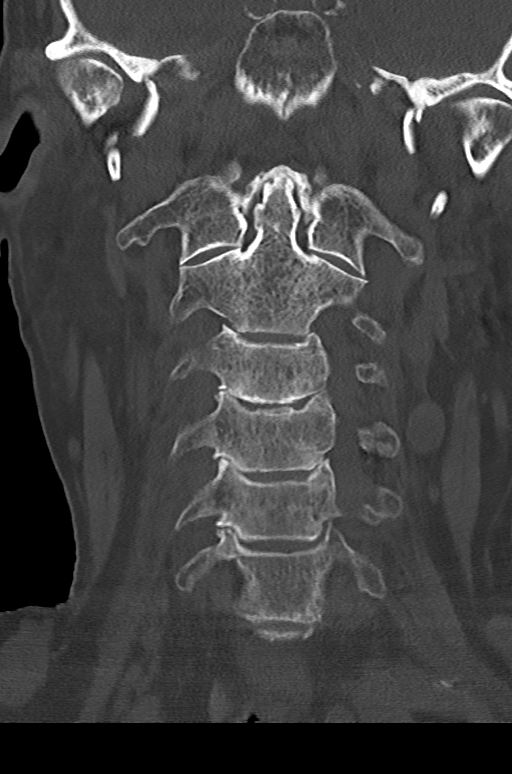
[im 37/61  bone]
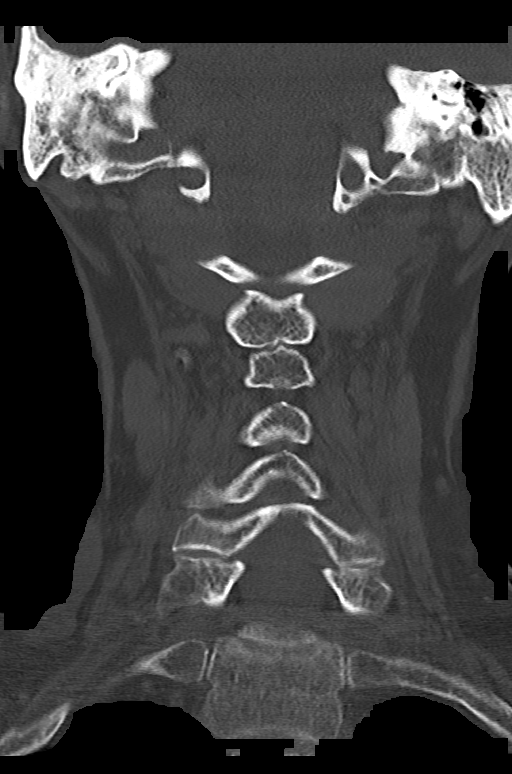

[Series 10: c_spine 2.0 orthogonals · oblique · 0.21mm/px · 2 of 100 slices shown]
[im 34/100  bone]
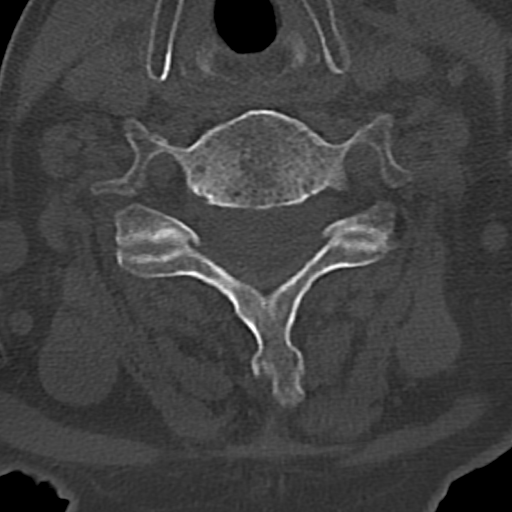
[im 67/100  bone]
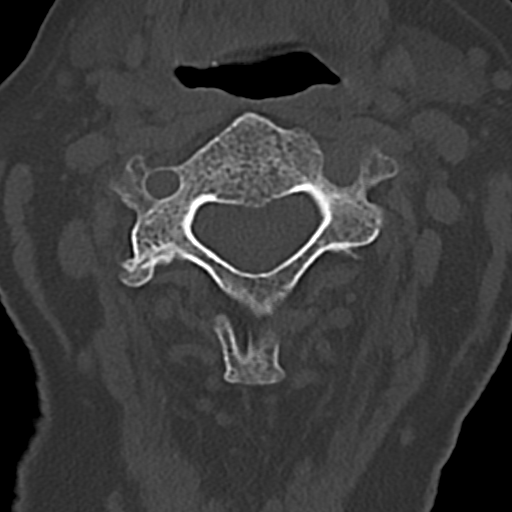

[14 of 33 positions shown; findings below may reference images not displayed]

FINDINGS: CT HEAD FINDINGS

Brain: Diffuse cerebral atrophy. Extensive low density throughout
the white matter and similar to the prior examination. No evidence
for acute hemorrhage, mass lesion, midline shift, hydrocephalus or
large infarct.

Vascular: No hyperdense vessel or unexpected calcification.

Skull: Normal. Negative for fracture or focal lesion.

Sinuses/Orbits: Small amount of mucosal disease in the sphenoid
sinuses.

Other: None.

CT CERVICAL SPINE FINDINGS

Alignment: Normal.

Skull base and vertebrae: No acute fracture. No primary bone lesion
or focal pathologic process.

Soft tissues and spinal canal: No prevertebral fluid or swelling. No
visible canal hematoma.

Disc levels: Mild disc space narrowing throughout the cervical
spine, most prominent at C3-C4. Facet arthropathy along the right
side C2-C3.

Upper chest: Negative.

Other: None
IMPRESSION: 1. No acute intracranial abnormality.
2. No acute bone abnormality in cervical spine.
3. Diffuse cerebral atrophy. Chronic white matter disease likely
represents chronic small vessel ischemic changes.
